# Patient Record
Sex: Male | Born: 1975 | Race: White | Hispanic: No | Marital: Married | Smoking: Never smoker
Health system: Southern US, Community
[De-identification: ages and names within clinical notes are randomized; demographics above are authoritative.]

## PROBLEM LIST (undated history)

## (undated) DIAGNOSIS — F32A Depression, unspecified: Secondary | ICD-10-CM

## (undated) DIAGNOSIS — J3489 Other specified disorders of nose and nasal sinuses: Secondary | ICD-10-CM

## (undated) DIAGNOSIS — K219 Gastro-esophageal reflux disease without esophagitis: Secondary | ICD-10-CM

## (undated) DIAGNOSIS — F329 Major depressive disorder, single episode, unspecified: Secondary | ICD-10-CM

## (undated) DIAGNOSIS — F419 Anxiety disorder, unspecified: Secondary | ICD-10-CM

## (undated) DIAGNOSIS — J343 Hypertrophy of nasal turbinates: Secondary | ICD-10-CM

## (undated) DIAGNOSIS — J342 Deviated nasal septum: Secondary | ICD-10-CM

## (undated) DIAGNOSIS — G4730 Sleep apnea, unspecified: Secondary | ICD-10-CM

## (undated) DIAGNOSIS — T4145XA Adverse effect of unspecified anesthetic, initial encounter: Secondary | ICD-10-CM

## (undated) DIAGNOSIS — I10 Essential (primary) hypertension: Secondary | ICD-10-CM

## (undated) DIAGNOSIS — T8859XA Other complications of anesthesia, initial encounter: Secondary | ICD-10-CM

## (undated) DIAGNOSIS — F988 Other specified behavioral and emotional disorders with onset usually occurring in childhood and adolescence: Secondary | ICD-10-CM

## (undated) HISTORY — PX: WISDOM TOOTH EXTRACTION: SHX21

## (undated) HISTORY — PX: INGUINAL HERNIA REPAIR: SHX194

---

## 2006-10-15 ENCOUNTER — Ambulatory Visit: Payer: Self-pay | Admitting: Family Medicine

## 2007-03-20 ENCOUNTER — Ambulatory Visit: Payer: Self-pay | Admitting: Family Medicine

## 2007-04-01 ENCOUNTER — Ambulatory Visit: Payer: Self-pay | Admitting: Family Medicine

## 2007-04-27 ENCOUNTER — Ambulatory Visit: Payer: Self-pay | Admitting: Family Medicine

## 2009-01-30 ENCOUNTER — Ambulatory Visit: Payer: Self-pay | Admitting: Professional

## 2009-02-20 ENCOUNTER — Ambulatory Visit: Payer: Self-pay | Admitting: Professional

## 2012-12-07 ENCOUNTER — Emergency Department (HOSPITAL_COMMUNITY): Payer: BC Managed Care – PPO

## 2012-12-07 ENCOUNTER — Emergency Department (HOSPITAL_COMMUNITY)
Admission: EM | Admit: 2012-12-07 | Discharge: 2012-12-07 | Disposition: A | Discharge status: Home / Self Care | Payer: BC Managed Care – PPO | Attending: Emergency Medicine | Admitting: Emergency Medicine

## 2012-12-07 DIAGNOSIS — M25519 Pain in unspecified shoulder: Secondary | ICD-10-CM | POA: Insufficient documentation

## 2012-12-07 DIAGNOSIS — R079 Chest pain, unspecified: Secondary | ICD-10-CM | POA: Insufficient documentation

## 2012-12-07 DIAGNOSIS — Z79899 Other long term (current) drug therapy: Secondary | ICD-10-CM | POA: Insufficient documentation

## 2012-12-07 LAB — BASIC METABOLIC PANEL
CO2: 26 mEq/L (ref 19–32)
Chloride: 102 mEq/L (ref 96–112)
Creatinine, Ser: 0.93 mg/dL (ref 0.50–1.35)
GFR calc Af Amer: >90 mL/min (ref >90)
Potassium: 3.8 mEq/L (ref 3.5–5.1)
Sodium: 138 mEq/L (ref 135–145)

## 2012-12-07 LAB — CBC
MCV: 86.9 fL (ref 78.0–100.0)
Platelets: 188 10*3/uL (ref 150–400)
RBC: 5.1 MIL/uL (ref 4.22–5.81)
WBC: 6.1 10*3/uL (ref 4.0–10.5)

## 2012-12-07 LAB — TROPONIN I: Troponin I: <0.3 ng/mL (ref <0.30)

## 2012-12-07 MED ORDER — IOHEXOL 300 MG/ML  SOLN
60.0000 mL | Freq: Once | INTRAMUSCULAR | Status: AC | PRN
  Administered 2012-12-07: 60 mL via INTRAVENOUS

## 2012-12-07 MED ORDER — IBUPROFEN 800 MG PO TABS: 800.0000 mg | ORAL_TABLET | Freq: Three times a day (TID) | ORAL | Status: DC

## 2012-12-07 MED ORDER — MORPHINE SULFATE 4 MG/ML IJ SOLN
4.0000 mg | Freq: Once | INTRAMUSCULAR | Status: AC
  Administered 2012-12-07: 4 mg via INTRAVENOUS
  Filled 2012-12-07: qty 1

## 2012-12-07 MED ORDER — OXYCODONE-ACETAMINOPHEN 5-325 MG PO TABS: 1.0000 | ORAL_TABLET | ORAL | Status: DC | PRN

## 2012-12-07 MED ORDER — MORPHINE SULFATE 4 MG/ML IJ SOLN
6.0000 mg | Freq: Once | INTRAMUSCULAR | Status: AC
  Administered 2012-12-07: 6 mg via INTRAVENOUS
  Filled 2012-12-07: qty 2

## 2012-12-07 MED ORDER — IOHEXOL 350 MG/ML SOLN
100.0000 mL | Freq: Once | INTRAVENOUS | Status: AC | PRN
  Administered 2012-12-07: 100 mL via INTRAVENOUS

## 2012-12-07 NOTE — ED Provider Notes (Signed)
History     CSN: [id number]  Arrival date & time 12/07/12  1528   First MD Initiated Contact with Patient 12/07/12 1537      Chief Complaint  Patient presents with  . Chest Pain     The history is provided by the patient.   patient reports developing acute onset right-sided chest pain with radiation up in towards his right neck and towards his right shoulder that occurred just after having a bowel movement when standing up from the commode.  His pain was severe and he was sharp.  He had no associated shortness of breath.  He continued to have discomfort this he went to an outside urgent care.  At the outside urgent care the patient had ongoing pain in his chest was hypertensive and they sent patient to the emergency department for further evaluation.  Patient has no medical problems.  He has no family history of early cardiac disease.  He denies nausea vomiting diarrhea.  No shortness of breath.  He does not smoke cigarettes.  He states his pain continues at this time.  No exertional shortness of breath or chest pain ever before in the past.  No past medical history on file.  No past surgical history on file.  No family history on file.  History  Substance Use Topics  . Smoking status: Not on file  . Smokeless tobacco: Not on file  . Alcohol Use: Not on file      Review of Systems  All other systems reviewed and are negative.    Allergies  Review of patient's allergies indicates no known allergies.  Home Medications   Current Outpatient Rx  Name  Route  Sig  Dispense  Refill  . AMPHETAMINE-DEXTROAMPHETAMINE 20 MG PO TABS   Oral   Take 20 mg by mouth 2 (two) times daily.         . DULOXETINE HCL 60 MG PO CPEP   Oral   Take 60 mg by mouth daily.           BP 149/103  Pulse 74  Temp 97.9 F (36.6 C) (Oral)  Resp 14  SpO2 99%  Physical Exam  Nursing note and vitals reviewed. Constitutional: He is oriented to person, place, and time. He appears  well-developed and well-nourished.  HENT:  Head: Normocephalic and atraumatic.  Eyes: EOM are normal.  Neck: Normal range of motion.  Cardiovascular: Normal rate, regular rhythm, normal heart sounds and intact distal pulses.   Pulmonary/Chest: Effort normal and breath sounds normal. No respiratory distress.  Abdominal: Soft. He exhibits no distension. There is no tenderness.  Musculoskeletal: Normal range of motion.  Neurological: He is alert and oriented to person, place, and time.  Skin: Skin is warm and dry.  Psychiatric: He has a normal mood and affect. Judgment normal.    ED Course  Procedures (including critical care time)  Labs Reviewed  CBC - Abnormal; Notable for the following:    MCHC 36.6 (*)     All other components within normal limits  BASIC METABOLIC PANEL  TROPONIN I   Dg Chest 2 View  12/07/2012  *RADIOLOGY REPORT*  Clinical Data: Chest pain.  CHEST - 2 VIEW  Comparison: Same day.  Findings: Cardiomediastinal silhouette appears normal.  No acute pulmonary disease is noted.  Bony thorax is intact.  IMPRESSION: No acute cardiopulmonary abnormality seen.   Original Report Authenticated By: Lupita Raider.,  M.D.    I personally reviewed the imaging tests  through PACS system I reviewed available ER/hospitalization records through the EMR   Date: 12/07/2012  Rate: 71  Rhythm: normal sinus rhythm  QRS Axis: normal  Intervals: normal  ST/T Wave abnormalities: normal  Conduction Disutrbances: none  Narrative Interpretation: PVC  Old EKG Reviewed: no prior ecg     No diagnosis found.    MDM  Patient continues to have some low-grade pain.  My suspicion for ACS is very low.  Given his acute onset pain and irradiation (his right shoulder and right neck dissection is still a possibility despite  A normal x-ray.  CT scan to evaluate possible aortic dissection.  Patient is currently in CT scan for        Lyanne Co, MD 12/07/12 2031

## 2012-12-07 NOTE — ED Provider Notes (Signed)
Pt received from Dr. Patria Mane.  CT negative for acute pathology.  Results, including incidental finding of multiple nodules, discussed w/ pt.  He has been reassured.  Continues to complain of pain in his upper back.  VSS and pt in NAD.  D/c'd home w/ percocet and 800mg  ibuprofen for symptomatic treatment of musculoskeletal pain.  Recommended f/u with PCP this week.  Return precautions discussed.  10:43 PM   Otilio Miu, PA-C 12/07/12 2244

## 2012-12-07 NOTE — ED Notes (Signed)
MD Campos at bedside.  

## 2012-12-07 NOTE — ED Notes (Signed)
Pt reports right shoulder pain that is "now creeping up to a four/ten pain." Denies N/V. NAD noted.

## 2012-12-07 NOTE — ED Notes (Addendum)
Per EMS: Pt from [facility] experiencing sudden 5/10 substernal CP radiating to chest and jaw. Pt reports pain dissolved on it own, reporting "chest tightness" now. Given 3 nitro en route and 324 mg aspirin. Denies N/V/SOB. AO x4. 160/100.

## 2012-12-08 NOTE — ED Provider Notes (Signed)
Medical screening examination/treatment/procedure(s) were performed by non-physician practitioner and as supervising physician I was immediately available for consultation/collaboration.  Raeford Razor, MD 12/08/12 1534

## 2013-09-04 DIAGNOSIS — J343 Hypertrophy of nasal turbinates: Secondary | ICD-10-CM

## 2013-09-04 DIAGNOSIS — J342 Deviated nasal septum: Secondary | ICD-10-CM

## 2013-09-04 HISTORY — DX: Deviated nasal septum: J34.2

## 2013-09-04 HISTORY — DX: Hypertrophy of nasal turbinates: J34.3

## 2013-09-24 ENCOUNTER — Encounter (HOSPITAL_BASED_OUTPATIENT_CLINIC_OR_DEPARTMENT_OTHER): Payer: Self-pay | Admitting: *Deleted

## 2013-09-24 DIAGNOSIS — J3489 Other specified disorders of nose and nasal sinuses: Secondary | ICD-10-CM

## 2013-09-24 HISTORY — DX: Other specified disorders of nose and nasal sinuses: J34.89

## 2013-09-24 NOTE — Progress Notes (Signed)
09/24/13 1000  OBSTRUCTIVE SLEEP APNEA  Have you ever been diagnosed with sleep apnea through a sleep study? No  Do you snore loudly (loud enough to be heard through closed doors)?  1  Do you often feel tired, fatigued, or sleepy during the daytime? 1  Has anyone observed you stop breathing during your sleep? 1  Do you have, or are you being treated for high blood pressure? 1  BMI more than 35 kg/m2? 0  Age over 38 years old? 0  Gender: 1  Obstructive Sleep Apnea Score 5  Score 4 or greater  Results sent to PCP (Dr. Joette Catching)

## 2013-09-24 NOTE — Pre-Procedure Instructions (Signed)
Will go to [place] for BMET 

## 2013-09-29 ENCOUNTER — Encounter (HOSPITAL_COMMUNITY)
Admission: RE | Admit: 2013-09-29 | Discharge: 2013-09-29 | Disposition: A | Discharge status: Home / Self Care | Payer: BC Managed Care – PPO | Source: Ambulatory Visit | Attending: Otolaryngology | Admitting: Otolaryngology

## 2013-09-29 DIAGNOSIS — Z01812 Encounter for preprocedural laboratory examination: Secondary | ICD-10-CM | POA: Insufficient documentation

## 2013-09-29 DIAGNOSIS — Z01818 Encounter for other preprocedural examination: Secondary | ICD-10-CM | POA: Insufficient documentation

## 2013-09-29 LAB — BASIC METABOLIC PANEL
BUN: 16 mg/dL (ref 6–23)
CO2: 21 mEq/L (ref 19–32)
Calcium: 9.2 mg/dL (ref 8.4–10.5)
Chloride: 102 mEq/L (ref 96–112)
Creatinine, Ser: 0.98 mg/dL (ref 0.50–1.35)
GFR calc Af Amer: >90 mL/min (ref >90)
GFR calc non Af Amer: >90 mL/min (ref >90)
Glucose, Bld: 160 mg/dL — ABNORMAL HIGH (ref 70–99)
Potassium: 4 mEq/L (ref 3.5–5.1)
Sodium: 137 mEq/L (ref 135–145)

## 2013-09-29 NOTE — H&P (Signed)
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 Chronic rhinitis (472.0) (J31.0). Esophageal reflux (530.81) (K21.9). Snoring (786.09) (R06.83). Orders  CT Sinus Limited; Requested for: 27 Jul 2013. Discussed  No radiographic evidence of sinus disease. He consider nasal septoplasty. He is going to ask his wife to observe him at night to see if he keeps his mouth open. If so then surgery may be helpful. She will also observe for any evidence of sleep apnea and if so, we will recommend a sleep study.   Postnasal drainage and is likely reflux-related. Recommend complete caffeine avoidance. If this fails to clear the symptoms, then PPI may be indicated.   . Reason For Visit  Carlos Bond is here today at the kind request of none for consultation and opinion for sinus congestion. HPI  Lifelong history of sinus problems. His symptoms include fullness of the ears, and needing to pop in his ears, stuffy nose, postnasal drip. He has some trouble breathing through his nose. He has been on antibiotics multiple times over the years. He has been on steroids as well. Nasal steroid spray has been tried but nothing has really helped. No known trauma to the nose. He does suffer with occasional heartburn. He drinks a couple of cups of coffee each morning. Allergies  No Known Drug Allergies. Current Meds  Lisinopril 10 MG Oral Tablet;; RPT Amphetamine-Dextroamphetamine 20 MG Oral Tablet;; RPT DULoxetine HCl - 60 MG Oral Capsule Delayed Release Particles;; RPT. PSH  Hernia Repair Oral Surgery Tooth Extraction. Family Hx  Family history of diabetes mellitus (V18.0) (Z83.3) Family history of hearing loss: Father (V19.2) (Z82.2) Family history of hypertension: Father (V17.49) (Z82.49) No pertinent family history: Mother Stroke syndrome: Father (I67.89). 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 ROS   Systemic: Feeling tired (fatigue).  No fever, no night sweats, and no recent weight loss. Head: Headache. Eyes: Eye symptoms. Otolaryngeal: No hearing loss  and no earache.  Tinnitus  and purulent nasal discharge.  No nasal passage blockage (stuffiness).  Snoring  and sneezing.  No hoarseness.  Sore throat. Cardiovascular: No chest pain or discomfort  and no palpitations. Pulmonary: Dyspnea, cough, and wheezing. Gastrointestinal: No dysphagia.  Heartburn.  No nausea, no abdominal pain, and no melena.  No diarrhea. Genitourinary: No dysuria. Endocrine: No muscle weakness. Musculoskeletal: No calf muscle cramps, no arthralgias, and no soft tissue swelling. Neurological: Dizziness.  No fainting, no tingling, and no numbness. Psychological: No anxiety  and no depression. Skin: No rash. 12 system ROS was obtained and reviewed on the Health Maintenance form dated today.  Positive responses are shown above.  If the symptom is not checked, the patient has denied it. Vital Signs   Recorded by Skolimowski,Sharon on 27 Jul 2013 03:18 PM BP:128/84,  Height: 70 in, Weight: 239 lb, BMI: 34.3 kg/m2,  BMI Calculated: 34.29 ,  BSA Calculated: 2.25. Physical Exam  APPEARANCE: Well developed, well nourished, in no acute distress.  Normal affect, in a pleasant mood.  Oriented to time, place and person. COMMUNICATION: Normal voice   HEAD & FACE:  No scars, lesions or masses of head and face.  Sinuses nontender to palpation.  Salivary glands without mass or tenderness.  Facial strength symmetric.  No facial lesion, scars, or mass. EYES: EOMI with normal primary gaze alignment. Visual acuity grossly intact.  PERRLA EXTERNAL EAR & NOSE: No scars, lesions or masses  EAC & TYMPANIC MEMBRANE:  EAC shows  no obstructing lesions or debris and tympanic membranes are normal bilaterally with good movement to insufflation. GROSS HEARING: Normal   TMJ:  Nontender  INTRANASAL EXAM: No polyps or purulence. Significant  bilateral septal spurs with what appears to be traumatic pleating. NASOPHARYNX: Normal, without lesions. LIPS, TEETH & GUMS: No lip lesions, normal dentition and normal gums. ORAL CAVITY/OROPHARYNX:  Oral mucosa moist without lesion or asymmetry of the palate, tongue, tonsil or posterior pharynx. NECK:  Supple without adenopathy or mass. THYROID:  Normal with no masses palpable.  NEUROLOGIC:  No gross CN deficits. No nystagmus noted.   LYMPHATIC:  No enlarged nodes palpable. Results  Sinus CT negative for sinusitis. Signature  Electronically signed by : Serena Colonel  M.D.; 07/27/2013 4:29 PM EST.

## 2013-10-04 ENCOUNTER — Encounter (HOSPITAL_BASED_OUTPATIENT_CLINIC_OR_DEPARTMENT_OTHER): Payer: BC Managed Care – PPO | Admitting: *Deleted

## 2013-10-04 ENCOUNTER — Ambulatory Visit (HOSPITAL_BASED_OUTPATIENT_CLINIC_OR_DEPARTMENT_OTHER)
Admission: RE | Admit: 2013-10-04 | Discharge: 2013-10-04 | Disposition: A | Discharge status: Home / Self Care | Payer: BC Managed Care – PPO | Source: Ambulatory Visit | Attending: Otolaryngology | Admitting: Otolaryngology

## 2013-10-04 ENCOUNTER — Encounter (HOSPITAL_BASED_OUTPATIENT_CLINIC_OR_DEPARTMENT_OTHER)
Admission: RE | Disposition: A | Discharge status: Home / Self Care | Payer: Self-pay | Source: Ambulatory Visit | Attending: Otolaryngology

## 2013-10-04 ENCOUNTER — Encounter (HOSPITAL_BASED_OUTPATIENT_CLINIC_OR_DEPARTMENT_OTHER): Payer: Self-pay | Admitting: *Deleted

## 2013-10-04 ENCOUNTER — Ambulatory Visit (HOSPITAL_BASED_OUTPATIENT_CLINIC_OR_DEPARTMENT_OTHER): Payer: BC Managed Care – PPO | Admitting: *Deleted

## 2013-10-04 DIAGNOSIS — J343 Hypertrophy of nasal turbinates: Secondary | ICD-10-CM | POA: Insufficient documentation

## 2013-10-04 DIAGNOSIS — R12 Heartburn: Secondary | ICD-10-CM | POA: Insufficient documentation

## 2013-10-04 DIAGNOSIS — J342 Deviated nasal septum: Secondary | ICD-10-CM

## 2013-10-04 DIAGNOSIS — Q674 Other congenital deformities of skull, face and jaw: Secondary | ICD-10-CM

## 2013-10-04 DIAGNOSIS — R0982 Postnasal drip: Secondary | ICD-10-CM | POA: Insufficient documentation

## 2013-10-04 HISTORY — DX: Hypertrophy of nasal turbinates: J34.3

## 2013-10-04 HISTORY — DX: Deviated nasal septum: J34.2

## 2013-10-04 HISTORY — DX: Essential (primary) hypertension: I10

## 2013-10-04 HISTORY — DX: Other specified behavioral and emotional disorders with onset usually occurring in childhood and adolescence: F98.8

## 2013-10-04 HISTORY — DX: Major depressive disorder, single episode, unspecified: F32.9

## 2013-10-04 HISTORY — DX: Depression, unspecified: F32.A

## 2013-10-04 HISTORY — DX: Other specified disorders of nose and nasal sinuses: J34.89

## 2013-10-04 HISTORY — DX: Anxiety disorder, unspecified: F41.9

## 2013-10-04 HISTORY — DX: Adverse effect of unspecified anesthetic, initial encounter: T41.45XA

## 2013-10-04 HISTORY — DX: Other complications of anesthesia, initial encounter: T88.59XA

## 2013-10-04 HISTORY — PX: NASAL SEPTOPLASTY W/ TURBINOPLASTY: SHX2070

## 2013-10-04 HISTORY — DX: Gastro-esophageal reflux disease without esophagitis: K21.9

## 2013-10-04 LAB — POCT HEMOGLOBIN-HEMACUE: Hemoglobin: 15.6 g/dL (ref 13.0–17.0)

## 2013-10-04 SURGERY — SEPTOPLASTY, NOSE, WITH NASAL TURBINATE REDUCTION
Anesthesia: General | Laterality: Bilateral | Wound class: Clean Contaminated

## 2013-10-04 MED ORDER — PROMETHAZINE HCL 25 MG RE SUPP: 25.0000 mg | Freq: Four times a day (QID) | RECTAL | Status: DC | PRN

## 2013-10-04 MED ORDER — OXYCODONE HCL 5 MG PO TABS
ORAL_TABLET | ORAL | Status: AC
  Filled 2013-10-04: qty 1

## 2013-10-04 MED ORDER — FENTANYL CITRATE 0.05 MG/ML IJ SOLN
INTRAMUSCULAR | Status: DC | PRN
  Administered 2013-10-04: 100 ug via INTRAVENOUS

## 2013-10-04 MED ORDER — OXYCODONE HCL 5 MG/5ML PO SOLN: 5.0000 mg | Freq: Once | ORAL | Status: AC | PRN

## 2013-10-04 MED ORDER — LIDOCAINE HCL (CARDIAC) 20 MG/ML IV SOLN
INTRAVENOUS | Status: DC | PRN
  Administered 2013-10-04: 100 mg via INTRAVENOUS

## 2013-10-04 MED ORDER — BACITRACIN ZINC 500 UNIT/GM EX OINT
TOPICAL_OINTMENT | CUTANEOUS | Status: AC
  Filled 2013-10-04: qty 28.35

## 2013-10-04 MED ORDER — HYDROMORPHONE HCL PF 1 MG/ML IJ SOLN
0.2500 mg | INTRAMUSCULAR | Status: DC | PRN
  Administered 2013-10-04 (×4): 0.5 mg via INTRAVENOUS

## 2013-10-04 MED ORDER — OXYMETAZOLINE HCL 0.05 % NA SOLN
NASAL | Status: AC
  Filled 2013-10-04: qty 15

## 2013-10-04 MED ORDER — MIDAZOLAM HCL 2 MG/2ML IJ SOLN
INTRAMUSCULAR | Status: AC
  Filled 2013-10-04: qty 2

## 2013-10-04 MED ORDER — FENTANYL CITRATE 0.05 MG/ML IJ SOLN
INTRAMUSCULAR | Status: AC
  Filled 2013-10-04: qty 4

## 2013-10-04 MED ORDER — LACTATED RINGERS IV SOLN
INTRAVENOUS | Status: DC
  Administered 2013-10-04 (×2): via INTRAVENOUS

## 2013-10-04 MED ORDER — MIDAZOLAM HCL 2 MG/ML PO SYRP: 12.0000 mg | ORAL_SOLUTION | Freq: Once | ORAL | Status: DC | PRN

## 2013-10-04 MED ORDER — HYDROMORPHONE HCL PF 1 MG/ML IJ SOLN
INTRAMUSCULAR | Status: AC
  Filled 2013-10-04: qty 1

## 2013-10-04 MED ORDER — PROPOFOL 10 MG/ML IV BOLUS
INTRAVENOUS | Status: DC | PRN
  Administered 2013-10-04: 50 mg via INTRAVENOUS
  Administered 2013-10-04: 250 mg via INTRAVENOUS

## 2013-10-04 MED ORDER — SUCCINYLCHOLINE CHLORIDE 20 MG/ML IJ SOLN
INTRAMUSCULAR | Status: DC | PRN
  Administered 2013-10-04: 160 mg via INTRAVENOUS

## 2013-10-04 MED ORDER — OXYMETAZOLINE HCL 0.05 % NA SOLN
NASAL | Status: DC | PRN
  Administered 2013-10-04: 1 via NASAL

## 2013-10-04 MED ORDER — PROMETHAZINE HCL 25 MG/ML IJ SOLN: 6.2500 mg | INTRAMUSCULAR | Status: DC | PRN

## 2013-10-04 MED ORDER — SUCCINYLCHOLINE CHLORIDE 20 MG/ML IJ SOLN
INTRAMUSCULAR | Status: AC
  Filled 2013-10-04: qty 1

## 2013-10-04 MED ORDER — CEPHALEXIN 500 MG PO CAPS: 500.0000 mg | ORAL_CAPSULE | Freq: Three times a day (TID) | ORAL | Status: DC

## 2013-10-04 MED ORDER — PROPOFOL 10 MG/ML IV BOLUS
INTRAVENOUS | Status: AC
  Filled 2013-10-04: qty 20

## 2013-10-04 MED ORDER — MIDAZOLAM HCL 2 MG/2ML IJ SOLN: 1.0000 mg | INTRAMUSCULAR | Status: DC | PRN

## 2013-10-04 MED ORDER — LIDOCAINE-EPINEPHRINE 1 %-1:100000 IJ SOLN
INTRAMUSCULAR | Status: DC | PRN
  Administered 2013-10-04: 4 mL

## 2013-10-04 MED ORDER — BACITRACIN ZINC 500 UNIT/GM EX OINT
TOPICAL_OINTMENT | CUTANEOUS | Status: AC
  Filled 2013-10-04: qty 0.9

## 2013-10-04 MED ORDER — LIDOCAINE-EPINEPHRINE 1 %-1:100000 IJ SOLN
INTRAMUSCULAR | Status: AC
  Filled 2013-10-04: qty 1

## 2013-10-04 MED ORDER — CEFAZOLIN SODIUM-DEXTROSE 2-3 GM-% IV SOLR
INTRAVENOUS | Status: DC | PRN
  Administered 2013-10-04: 2 g via INTRAVENOUS

## 2013-10-04 MED ORDER — MIDAZOLAM HCL 5 MG/5ML IJ SOLN
INTRAMUSCULAR | Status: DC | PRN
  Administered 2013-10-04: 2 mg via INTRAVENOUS

## 2013-10-04 MED ORDER — BACITRACIN ZINC 500 UNIT/GM EX OINT
TOPICAL_OINTMENT | CUTANEOUS | Status: DC | PRN
  Administered 2013-10-04: 1 via TOPICAL

## 2013-10-04 MED ORDER — OXYMETAZOLINE HCL 0.05 % NA SOLN
3.0000 | NASAL | Status: AC
  Administered 2013-10-04: 3 via NASAL

## 2013-10-04 MED ORDER — HYDROCODONE-ACETAMINOPHEN 7.5-325 MG PO TABS: 1.0000 | ORAL_TABLET | Freq: Four times a day (QID) | ORAL | Status: DC | PRN

## 2013-10-04 MED ORDER — OXYCODONE HCL 5 MG PO TABS
5.0000 mg | ORAL_TABLET | Freq: Once | ORAL | Status: AC | PRN
  Administered 2013-10-04: 5 mg via ORAL

## 2013-10-04 MED ORDER — MINERAL OIL LIGHT 100 % EX OIL
TOPICAL_OIL | CUTANEOUS | Status: AC
  Filled 2013-10-04: qty 25

## 2013-10-04 MED ORDER — FENTANYL CITRATE 0.05 MG/ML IJ SOLN: 50.0000 ug | INTRAMUSCULAR | Status: DC | PRN

## 2013-10-04 MED ORDER — DEXAMETHASONE SODIUM PHOSPHATE 4 MG/ML IJ SOLN
INTRAMUSCULAR | Status: DC | PRN
  Administered 2013-10-04: 10 mg via INTRAVENOUS

## 2013-10-04 SURGICAL SUPPLY — 29 items
APPLICATOR COTTON TIP 6IN STRL (MISCELLANEOUS) ×1 IMPLANT
ATTRACTOMAT 16X20 MAGNETIC DRP (DRAPES) IMPLANT
CANISTER SUCT 1200ML W/VALVE (MISCELLANEOUS) ×2 IMPLANT
DECANTER SPIKE VIAL GLASS SM (MISCELLANEOUS) ×1 IMPLANT
DRSG NASOPORE 8CM (GAUZE/BANDAGES/DRESSINGS) IMPLANT
DRSG TELFA 3X8 NADH (GAUZE/BANDAGES/DRESSINGS) ×2 IMPLANT
GAUZE SPONGE 4X4 16PLY XRAY LF (GAUZE/BANDAGES/DRESSINGS) IMPLANT
GLOVE BIOGEL PI IND STRL 7.0 (GLOVE) IMPLANT
GLOVE BIOGEL PI INDICATOR 7.0 (GLOVE) ×1
GLOVE ECLIPSE 6.5 STRL STRAW (GLOVE) ×1 IMPLANT
GLOVE ECLIPSE 7.5 STRL STRAW (GLOVE) ×2 IMPLANT
GOWN PREVENTION PLUS XLARGE (GOWN DISPOSABLE) ×4 IMPLANT
HEMOSTAT SURGICEL .5X2 ABSORB (HEMOSTASIS) IMPLANT
NEEDLE 27GAX1X1/2 (NEEDLE) ×2 IMPLANT
NS IRRIG 1000ML POUR BTL (IV SOLUTION) ×1 IMPLANT
PACK BASIN DAY SURGERY FS (CUSTOM PROCEDURE TRAY) ×2 IMPLANT
PACK ENT DAY SURGERY (CUSTOM PROCEDURE TRAY) ×2 IMPLANT
PAD DRESSING TELFA 3X8 NADH (GAUZE/BANDAGES/DRESSINGS) IMPLANT
PATTIES SURGICAL .5 X3 (DISPOSABLE) ×2 IMPLANT
SHEET SILASTIC 8X6X.030 25-30 (MISCELLANEOUS) ×1 IMPLANT
SPONGE GAUZE 2X2 8PLY STRL LF (GAUZE/BANDAGES/DRESSINGS) ×2 IMPLANT
STRIP CLOSURE SKIN 1/2X4 (GAUZE/BANDAGES/DRESSINGS) IMPLANT
SUT CHROMIC 4 0 P 3 18 (SUTURE) ×2 IMPLANT
SUT ETHILON 3 0 PS 1 (SUTURE) IMPLANT
SUT ETHILON 4 0 CL P 3 (SUTURE) ×1 IMPLANT
SUT ETHILON 6 0 P 1 (SUTURE) IMPLANT
SUT PLAIN 4 0 ~~LOC~~ 1 (SUTURE) ×2 IMPLANT
TOWEL OR 17X24 6PK STRL BLUE (TOWEL DISPOSABLE) ×2 IMPLANT
YANKAUER SUCT BULB TIP NO VENT (SUCTIONS) ×2 IMPLANT

## 2013-10-04 NOTE — Anesthesia Preprocedure Evaluation (Signed)
Anesthesia Evaluation  Patient identified by MRN, date of birth, ID band Patient awake    Reviewed: Allergy & Precautions, H&P , NPO status , Patient's Chart, lab work & pertinent test results  Airway Mallampati: II      Dental  (+) Teeth Intact   Pulmonary  breath sounds clear to auscultation        Cardiovascular Rhythm:Regular Rate:Normal     Neuro/Psych Depression    GI/Hepatic negative GI ROS, GERD-  ,  Endo/Other  negative endocrine ROS  Renal/GU negative Renal ROS     Musculoskeletal negative musculoskeletal ROS (+)   Abdominal (+) + obese,   Peds  Hematology   Anesthesia Other Findings   Reproductive/Obstetrics                           Anesthesia Physical Anesthesia Plan  ASA: II  Anesthesia Plan: General   Post-op Pain Management:    Induction: Intravenous  Airway Management Planned: Oral ETT  Additional Equipment:   Intra-op Plan:   Post-operative Plan: Extubation in OR  Informed Consent: I have reviewed the patients History and Physical, chart, labs and discussed the procedure including the risks, benefits and alternatives for the proposed anesthesia with the patient or authorized representative who has indicated his/her understanding and acceptance.   Dental advisory given  Plan Discussed with:   Anesthesia Plan Comments:         Anesthesia Quick Evaluation

## 2013-10-04 NOTE — Anesthesia Postprocedure Evaluation (Signed)
  Anesthesia Post-op Note  Patient: Carlos Bond  Procedure(s) Performed: Procedure(s): NASAL SEPTOPLASTY WITH TURBINATE REDUCTION (Bilateral)  Patient Location: PACU  Anesthesia Type:General  Level of Consciousness: awake and alert   Airway and Oxygen Therapy: Patient Spontanous Breathing  Post-op Pain: mild  Post-op Assessment: Post-op Vital signs reviewed  Post-op Vital Signs: stable  Complications: No apparent anesthesia complications

## 2013-10-04 NOTE — Transfer of Care (Signed)
Immediate Anesthesia Transfer of Care Note  Patient: Carlos Bond  Procedure(s) Performed: Procedure(s): NASAL SEPTOPLASTY WITH TURBINATE REDUCTION (Bilateral)  Patient Location: PACU  Anesthesia Type:General  Level of Consciousness: awake, alert  and patient cooperative  Airway & Oxygen Therapy: Patient Spontanous Breathing and Patient connected to face mask oxygen  Post-op Assessment: Report given to PACU RN, Post -op Vital signs reviewed and stable and Patient moving all extremities  Post vital signs: Reviewed and stable  Complications: No apparent anesthesia complications

## 2013-10-04 NOTE — Op Note (Signed)
OPERATIVE REPORT  DATE OF SURGERY: 10/04/2013  PATIENT:  Carlos Bond,  37 y.o. male  PRE-OPERATIVE DIAGNOSIS:  DEVIATED NASAL SEPTUM AND TURBINATE HYPERTROPHY  POST-OPERATIVE DIAGNOSIS:  DEVIATED NASAL SEPTUM AND TURBINATE HYPERTROPHY  PROCEDURE:  Procedure(s): NASAL SEPTOPLASTY WITH TURBINATE REDUCTION  SURGEON: Susy Frizzle, MD   ASSISTANTS: none   ANESTHESIA: General   EBL: 25 ml   DRAINS: none   LOCAL MEDICATIONS USED: 1% Xylocaine with epinephrine   SPECIMEN: none   COUNTS: Correct   PROCEDURE DETAILS:   The patient was taken to the operating room and placed on the operating table in the supine position. Following induction of general endotracheal anesthesia, the table was positioned. Face was prepped and draped in a standard fashion. Afrin spray was used preoperatively. 1% Xylocaine with epinephrine was infiltrated into the septum on both sides, columella and the inferior turbinates bilaterally. Afrin pledges were placed in the nasal cavities bilaterally.   #1-septoplasty. A left hemitransfixion incision was used to approach the septal cartilage. A Cottle elevator was used to elevate mucosa posteriorly down the left side all the way to the sphenoid rostrum. A left floor tunnel was elevated as well and the two were connected. 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 This caused bilateral obstruction that was severe. The majority of the ethmoid plate, vomer and maxillary crest were resected. This allowed the septum to lie in midline position opening of the airways bilaterally. The left-sided incision was reapproximated with chromic suture. A quilting stitch was placed to reapposed the septal flaps bilaterally. There were bilateral tears present. Silastic splints were cut to size and shape and applied bilaterally secured in place with a through and through nylon suture.  #2-submucous resection inferior turbinates. The leading edge of inferior turbinates was  incised in a vertical fashion with a 15 scalpel. Cottle elevator was used to elevate mucosa off bone in all directions. Bony fragments were crushed and small pieces were removed. Turbinate remnants were outfractured bilaterally. Nasal cavities were suctioned of blood and secretions and packed with rolled up Telfa coated with bacitracin ointment. Pharynx was suctioned of blood and secretions. Patient was awakened extubated and transferred to recovery in stable condition.   PATIENT DISPOSITION: To PACU, stable

## 2013-10-04 NOTE — Anesthesia Procedure Notes (Signed)
Procedure Name: Intubation Date/Time: 10/04/2013 9:03 AM Performed by: Serena Colonel Pre-anesthesia Checklist: Patient identified, Emergency Drugs available, Suction available and Patient being monitored Patient Re-evaluated:Patient Re-evaluated prior to inductionOxygen Delivery Method: Circle System Utilized Preoxygenation: Pre-oxygenation with 100% oxygen Intubation Type: IV induction Ventilation: Mask ventilation without difficulty Laryngoscope Size: 4 Grade View: Grade III Tube type: Oral Number of attempts: 1 Airway Equipment and Method: stylet,  oral airway and Video-laryngoscopy Placement Confirmation: ETT inserted through vocal cords under direct vision,  positive ETCO2 and breath sounds checked- equal and bilateral Secured at: 24 cm Tube secured with: Tape Dental Injury: Teeth and Oropharynx as per pre-operative assessment

## 2013-10-04 NOTE — Interval H&P Note (Signed)
History and Physical Interval Note:  10/04/2013 8:34 AM  Carlos Bond  has presented today for surgery, with the diagnosis of DEVIATED NASAL SEPTUM AND TURBINATE HYPERTROPHY  The various methods of treatment have been discussed with the patient and family. After consideration of risks, benefits and other options for treatment, the patient has consented to  Procedure(s): NASAL SEPTOPLASTY WITH TURBINATE REDUCTION (Bilateral) as a surgical intervention .  The patient's history has been reviewed, patient examined, no change in status, stable for surgery.  I have reviewed the patient's chart and labs.  Questions were answered to the patient's satisfaction.     [name]

## 2013-10-05 ENCOUNTER — Encounter (HOSPITAL_BASED_OUTPATIENT_CLINIC_OR_DEPARTMENT_OTHER): Payer: Self-pay | Admitting: Otolaryngology

## 2016-07-19 ENCOUNTER — Encounter (HOSPITAL_COMMUNITY): Payer: Self-pay

## 2016-07-19 ENCOUNTER — Emergency Department (HOSPITAL_COMMUNITY): Payer: Worker's Compensation

## 2016-07-19 ENCOUNTER — Emergency Department (HOSPITAL_COMMUNITY)
Admission: EM | Admit: 2016-07-19 | Discharge: 2016-07-20 | Disposition: A | Discharge status: Home / Self Care | Payer: Worker's Compensation | Attending: Emergency Medicine | Admitting: Emergency Medicine

## 2016-07-19 DIAGNOSIS — S92402A Displaced unspecified fracture of left great toe, initial encounter for closed fracture: Secondary | ICD-10-CM

## 2016-07-19 DIAGNOSIS — F909 Attention-deficit hyperactivity disorder, unspecified type: Secondary | ICD-10-CM | POA: Diagnosis not present

## 2016-07-19 DIAGNOSIS — S92422A Displaced fracture of distal phalanx of left great toe, initial encounter for closed fracture: Secondary | ICD-10-CM | POA: Diagnosis not present

## 2016-07-19 DIAGNOSIS — Y939 Activity, unspecified: Secondary | ICD-10-CM | POA: Insufficient documentation

## 2016-07-19 DIAGNOSIS — Z79899 Other long term (current) drug therapy: Secondary | ICD-10-CM | POA: Insufficient documentation

## 2016-07-19 DIAGNOSIS — W208XXA Other cause of strike by thrown, projected or falling object, initial encounter: Secondary | ICD-10-CM | POA: Insufficient documentation

## 2016-07-19 DIAGNOSIS — Y999 Unspecified external cause status: Secondary | ICD-10-CM | POA: Diagnosis not present

## 2016-07-19 DIAGNOSIS — Y929 Unspecified place or not applicable: Secondary | ICD-10-CM | POA: Insufficient documentation

## 2016-07-19 DIAGNOSIS — S99922A Unspecified injury of left foot, initial encounter: Secondary | ICD-10-CM | POA: Diagnosis present

## 2016-07-19 DIAGNOSIS — I10 Essential (primary) hypertension: Secondary | ICD-10-CM | POA: Diagnosis not present

## 2016-07-19 HISTORY — DX: Sleep apnea, unspecified: G47.30

## 2016-07-19 MED ORDER — OXYCODONE-ACETAMINOPHEN 5-325 MG PO TABS
2.0000 | ORAL_TABLET | Freq: Once | ORAL | Status: AC
  Administered 2016-07-20: 2 via ORAL
  Filled 2016-07-19: qty 2

## 2016-07-19 NOTE — ED Triage Notes (Signed)
Patient states he dropped a piece of wood onto left foot/toe. C/o burning and numbness to left foot. Limited ROM and bruising noted.

## 2016-07-20 MED ORDER — OXYCODONE-ACETAMINOPHEN 5-325 MG PO TABS: 1.0000 | ORAL_TABLET | ORAL | 0 refills | Status: DC | PRN

## 2016-07-20 MED ORDER — LISINOPRIL 10 MG PO TABS: 10.0000 mg | ORAL_TABLET | Freq: Every day | ORAL | 0 refills | Status: DC

## 2016-07-20 NOTE — ED Notes (Signed)
Patient verbalizes understanding of discharge instructions, prescriptions, home care and follow up care. Patient out of department at this time with family. 

## 2016-07-20 NOTE — ED Provider Notes (Signed)
[facility]-EM[facility]Y DEPT Provider Note   CSN: [id number] Arrival date & time: 07/19/16  2203     History   Chief Complaint Chief Complaint  Patient presents with  . Foot Pain    HPI Carlos Bond is a 40 y.o. male.  HPI  Carlos Bond is a 40 y.o. male who presents to the Emergency Department complaining of left great toe pain for one week.  He states that he accidentally dropped a heavy piece of wood onto his foot.  He reports persistent, throbbing pain to the toe that's associated with weight bearing and movement.  He also complains of swelling and discoloration of the toenail.  He denies pain to the proximal foot, numbness or open wounds.  He has been taking OTC analgesics without relief.     Past Medical History:  Diagnosis Date  . ADD (attention deficit disorder)   . Anxiety   . Complication of anesthesia    states woke up during wisdom teeth extraction  . Depression   . Deviated nasal septum 09/2013  . GERD (gastroesophageal reflux disease)   . Hypertension    under control with med., has been on med. < 1 yr.  . Nasal turbinate hypertrophy 09/2013  . Sleep apnea   . Stuffy and runny nose 09/24/2013   clear drainage from nose    There are no active problems to display for this patient.   Past Surgical History:  Procedure Laterality Date  . INGUINAL HERNIA REPAIR     as a child  . NASAL SEPTOPLASTY W/ TURBINOPLASTY Bilateral 10/04/2013   Procedure: NASAL SEPTOPLASTY WITH TURBINATE REDUCTION;  Surgeon: Serena Colonel, MD;  Location: [facility];  Service: ENT;  Laterality: Bilateral;  . WISDOM TOOTH EXTRACTION     as a teenager       Home Medications    Prior to Admission medications   Medication Sig Start Date End Date Taking? Authorizing Provider  amphetamine-dextroamphetamine (ADDERALL) 20 MG tablet Take 20 mg by mouth 2 (two) times daily.    Historical Provider, MD  cephALEXin (KEFLEX) 500 MG capsule Take 1 capsule (500 mg total) by mouth 3  (three) times daily. 10/04/13   Serena Colonel, MD  DULoxetine (CYMBALTA) 60 MG capsule Take 90 mg by mouth daily.     Historical Provider, MD  HYDROcodone-acetaminophen (NORCO) 7.5-325 MG per tablet Take 1 tablet by mouth every 6 (six) hours as needed for moderate pain. 10/04/13   Serena Colonel, MD  lisinopril (PRINIVIL,ZESTRIL) 10 MG tablet Take 1 tablet (10 mg total) by mouth daily. 07/20/16   Marlee Armenteros, PA-C  LORazepam (ATIVAN) 0.5 MG tablet Take 0.5 mg by mouth every 8 (eight) hours.    Historical Provider, MD  oxyCODONE-acetaminophen (PERCOCET/ROXICET) 5-325 MG tablet Take 1 tablet by mouth every 4 (four) hours as needed. 07/20/16   Tawan Degroote, PA-C  promethazine (PHENERGAN) 25 MG suppository Place 1 suppository (25 mg total) rectally every 6 (six) hours as needed for nausea or vomiting. 10/04/13   Serena Colonel, MD  ranitidine (ZANTAC) 150 MG tablet Take 150 mg by mouth 2 (two) times daily.    Historical Provider, MD    Family History History reviewed. No pertinent family history.  Social History Social History  Substance Use Topics  . Smoking status: Never Smoker  . Smokeless tobacco: Never Used  . Alcohol use Yes     Comment: 2-3x / week     Allergies   Review of patient's allergies indicates no known  allergies.   Review of Systems Review of Systems  Constitutional: Negative for chills and fever.  Musculoskeletal: Positive for arthralgias (left great toe pain and swelling) and joint swelling.  Skin: Negative for wound.  Neurological: Negative for weakness and numbness.  All other systems reviewed and are negative.    Physical Exam Updated Vital Signs BP (!) 176/112 (BP Location: Left Arm)   Pulse 83   Temp 98.6 F (37 C) (Oral)   Resp 18   Ht 5\' 9"  (1.753 m)   Wt 113.4 kg   SpO2 99%   BMI 36.92 kg/m   Physical Exam  Constitutional: He is oriented to person, place, and time. He appears well-developed and well-nourished. No distress.  HENT:  Head:  Normocephalic and atraumatic.  Cardiovascular: Normal rate, regular rhythm and intact distal pulses.   Pulmonary/Chest: Effort normal and breath sounds normal.  Musculoskeletal: He exhibits edema and tenderness. He exhibits no deformity.  Diffuse ttp of the left great toe with subungual hematoma. DP pulse is brisk,distal sensation intact.  No bony deformity.  No proximal tenderness.  Neurological: He is alert and oriented to person, place, and time. He exhibits normal muscle tone. Coordination normal.  Skin: Skin is warm and dry.  Nursing note and vitals reviewed.    ED Treatments / Results  Labs (all labs ordered are listed, but only abnormal results are displayed) Labs Reviewed - No data to display  EKG  EKG Interpretation None       Radiology Dg Ankle Complete Left  Result Date: 07/19/2016 CLINICAL DATA:  Pain and swelling. Left great toe pain, left ankle soreness. Dropped a piece of wood on his foot. History of left ankle fracture. EXAM: LEFT ANKLE COMPLETE - 3+ VIEW COMPARISON:  None. FINDINGS: There is no evidence of fracture, dislocation, or joint effusion. There is no evidence of arthropathy or other focal bone abnormality. Soft tissues are unremarkable. IMPRESSION: Negative radiographs of the left ankle. Electronically Signed   By: Rubye Oaks M.D.   On: 07/19/2016 23:22   Dg Foot Complete Left  Result Date: 07/19/2016 CLINICAL DATA:  Pain and swelling. Left great toe pain, left ankle soreness. Dropped a piece of wood on his foot. History of left ankle fracture. EXAM: LEFT FOOT - COMPLETE 3+ VIEW COMPARISON:  None. FINDINGS: Transverse mildly displaced great toe distal phalanx fracture. There is no intra-articular extension. No additional acute fracture of the foot. Soft tissue edema about the fracture site. IMPRESSION: Transverse minimally displaced great toe distal phalanx fracture. Electronically Signed   By: Rubye Oaks M.D.   On: 07/19/2016 23:22     Procedures Procedures (including critical care time)  Medications Ordered in ED Medications  oxyCODONE-acetaminophen (PERCOCET/ROXICET) 5-325 MG per tablet 2 tablet (2 tablets Oral Given 07/20/16 0000)     Initial Impression / Assessment and Plan / ED Course  I have reviewed the triage vital signs and the nursing notes.  Pertinent labs & imaging results that were available during my care of the patient were reviewed by me and considered in my medical decision making (see chart for details).  Clinical Course    Pt with closed fx of the left great toe.  Subungual hematoma present, pt declined to have it drained.    Jones dressing applied, post op shoe and crutches given.  Discussed importance of close orthopedic or podiatry follow-up.    Pt remains hypertensive, has hx of same. Asymptomatic except for foot pain.   Has been off his lisinopril  for months, will refill it with understanding that he will arrange PMD f/u  Final Clinical Impressions(s) / ED Diagnoses   Final diagnoses:  Fractured great toe, left, closed, initial encounter  Essential hypertension    New Prescriptions New Prescriptions   LISINOPRIL (PRINIVIL,ZESTRIL) 10 MG TABLET    Take 1 tablet (10 mg total) by mouth daily.   OXYCODONE-ACETAMINOPHEN (PERCOCET/ROXICET) 5-325 MG TABLET    Take 1 tablet by mouth every 4 (four) hours as needed.     Rosey Bathammy Abrina Petz, PA-C 07/21/16 2033    Devoria AlbeIva Knapp, MD 08/07/16 2302

## 2016-07-20 NOTE — Discharge Instructions (Signed)
Elevate your foot when possible and apply ice packs on/off.  Call one of the providers listed to arrange a follow-up appt.  Return to ER for any worsening symptoms.  Its also important to follow-up with your primary doctor regarding your high blood pressure

## 2016-07-22 ENCOUNTER — Telehealth: Payer: Self-pay | Admitting: Orthopedic Surgery

## 2016-07-22 NOTE — Telephone Encounter (Signed)
Call received from [place] insurance adjuster Robie RidgeKathy Riser ph [phone], fax [phone]-[phone], requesting appointment for fractue of toe, [facility] 07/19/16; Claim # received [id number].  Forms faxed to her for completion; appointment pending. Patient aware of status

## 2016-07-23 ENCOUNTER — Ambulatory Visit (INDEPENDENT_AMBULATORY_CARE_PROVIDER_SITE_OTHER): Payer: Worker's Compensation | Admitting: Orthopaedic Surgery

## 2016-07-23 ENCOUNTER — Encounter: Payer: Self-pay | Admitting: Orthopaedic Surgery

## 2016-07-23 ENCOUNTER — Encounter: Payer: Self-pay | Admitting: Orthopedic Surgery

## 2016-07-23 VITALS — BP 148/90 | HR 70 | Temp 97.6°F | Ht 70.0 in | Wt 252.0 lb

## 2016-07-23 DIAGNOSIS — S92402A Displaced unspecified fracture of left great toe, initial encounter for closed fracture: Secondary | ICD-10-CM | POA: Diagnosis not present

## 2016-07-23 DIAGNOSIS — S90222A Contusion of left lesser toe(s) with damage to nail, initial encounter: Secondary | ICD-10-CM

## 2016-07-23 NOTE — Progress Notes (Signed)
Subjective: I broke my left great toe    Patient ID: Carlos Bond, male    DOB: [birth year], 40 y.o.   MRN: [id number]  HPI  He dropped a large piece of wood on his left great toe on 07-12-16.  He had pain, bluish color to the great toe and it hurt.  He waited a week and went to the ER on 07-19-16 as it was not any better.  X-rays shows a fracture transverse of the left great toe distal phalanx nondisplaced.  He has no other injury.  He was given post op shoe and pain medicine.  I have reviewed the x-rays and the reports from the ER.   Review of Systems  HENT: Negative for congestion.   Respiratory: Negative for cough and shortness of breath.   Cardiovascular: Negative for chest pain and leg swelling.  Endocrine: Negative for cold intolerance.  Musculoskeletal: Positive for arthralgias, gait problem and joint swelling.  Allergic/Immunologic: Negative for environmental allergies.   Past Medical History:  Diagnosis Date  . ADD (attention deficit disorder)   . Anxiety   . Complication of anesthesia    states woke up during wisdom teeth extraction  . Depression   . Deviated nasal septum 09/2013  . GERD (gastroesophageal reflux disease)   . Hypertension    under control with med., has been on med. < 1 yr.  . Nasal turbinate hypertrophy 09/2013  . Sleep apnea   . Stuffy and runny nose 09/24/2013   clear drainage from nose    Past Surgical History:  Procedure Laterality Date  . INGUINAL HERNIA REPAIR     as a child  . NASAL SEPTOPLASTY W/ TURBINOPLASTY Bilateral 10/04/2013   Procedure: NASAL SEPTOPLASTY WITH TURBINATE REDUCTION;  Surgeon: Serena ColonelJefry Rosen, MD;  Location: [facility];  Service: ENT;  Laterality: Bilateral;  . WISDOM TOOTH EXTRACTION     as a teenager    Current Outpatient Prescriptions on File Prior to Visit  Medication Sig Dispense Refill  . amphetamine-dextroamphetamine (ADDERALL) 20 MG tablet Take 20 mg by mouth 2 (two) times daily.    . cephALEXin  (KEFLEX) 500 MG capsule Take 1 capsule (500 mg total) by mouth 3 (three) times daily. 15 capsule 0  . DULoxetine (CYMBALTA) 60 MG capsule Take 90 mg by mouth daily.     [name]. HYDROcodone-acetaminophen (NORCO) 7.5-325 MG per tablet Take 1 tablet by mouth every 6 (six) hours as needed for moderate pain. 30 tablet 0  . lisinopril (PRINIVIL,ZESTRIL) 10 MG tablet Take 1 tablet (10 mg total) by mouth daily. 30 tablet 0  . LORazepam (ATIVAN) 0.5 MG tablet Take 0.5 mg by mouth every 8 (eight) hours.    [name]. oxyCODONE-acetaminophen (PERCOCET/ROXICET) 5-325 MG tablet Take 1 tablet by mouth every 4 (four) hours as needed. 20 tablet 0  . promethazine (PHENERGAN) 25 MG suppository Place 1 suppository (25 mg total) rectally every 6 (six) hours as needed for nausea or vomiting. (Patient not taking: Reported on 07/23/2016) 12 each 0  . ranitidine (ZANTAC) 150 MG tablet Take 150 mg by mouth 2 (two) times daily.     No current facility-administered medications on file prior to visit.     Social History   Social History  . Marital status: Married    Spouse name: N/A  . Number of children: N/A  . Years of education: N/A   Occupational History  . Not on file.   Social History Main Topics  . Smoking status: Never  Smoker  . Smokeless tobacco: Never Used  . Alcohol use Yes     Comment: 2-3x / week  . Drug use: No  . Sexual activity: Not on file   Other Topics Concern  . Not on file   Social History Narrative  . No narrative on file    Family History  Problem Relation Age of Onset  . Stroke Father   . Diabetes Father   . Hypertension Father     BP (!) 148/90   Pulse 70   Temp 97.6 F (36.4 C)   Ht 5\' 10"  (1.778 m)   Wt 252 lb (114.3 kg)   BMI 36.16 kg/m      Objective:   Physical Exam  Constitutional: He is oriented to person, place, and time. He appears well-developed and well-nourished.  HENT:  Head: Normocephalic and atraumatic.  Eyes: Conjunctivae and EOM are normal. Pupils are equal,  round, and reactive to light.  Neck: Normal range of motion. Neck supple.  Cardiovascular: Normal rate, regular rhythm and intact distal pulses.   Pulmonary/Chest: Effort normal.  Abdominal: Soft.  Musculoskeletal: He exhibits tenderness (His left great toe has subungual hematoma under the nail with blue color.  NV intact.  Right foot negative.).  Neurological: He is alert and oriented to person, place, and time. He has normal reflexes. He displays normal reflexes. No cranial nerve deficit. He exhibits normal muscle tone. Coordination normal.  Skin: Skin is warm and dry.  Psychiatric: He has a normal mood and affect. His behavior is normal. Judgment and thought content normal.   Procedure note  After sterile prep and permission from the patient, a butane lighter was used and a needle was used to pierce the nail of the left great toe and release the hematoma under the nail.  He tolerated it well.  He has much less pain from the toe after the procedure.  Blood came out of the hole in the nail.  A dressing was applied.       Assessment & Plan:   Encounter Diagnoses  Name Primary?  . Fractured great toe, left, closed, initial encounter Yes  . Subungual contusion of toe of left foot, initial encounter    Apply neosporin ointment tomorrow to the nail.  Use the post op shoe  Elevate the foot.  Ice to the foot.  Call if any problem.  Precautions discussed.  Electronically Signed Darreld Mclean, MD 9/19/20172:50 PM

## 2016-07-23 NOTE — Telephone Encounter (Signed)
Apiiontment has been scheduled for today, 07/23/16, 2:00pm, patient and insurer, [employer], workers comp, aware.

## 2016-07-30 ENCOUNTER — Encounter: Payer: Self-pay | Admitting: Orthopaedic Surgery

## 2016-07-30 ENCOUNTER — Ambulatory Visit (INDEPENDENT_AMBULATORY_CARE_PROVIDER_SITE_OTHER): Payer: Worker's Compensation

## 2016-07-30 ENCOUNTER — Ambulatory Visit (INDEPENDENT_AMBULATORY_CARE_PROVIDER_SITE_OTHER): Payer: Worker's Compensation | Admitting: Orthopaedic Surgery

## 2016-07-30 VITALS — BP 155/78 | HR 75 | Temp 97.3°F | Ht 71.0 in | Wt 254.0 lb

## 2016-07-30 DIAGNOSIS — S92912D Unspecified fracture of left toe(s), subsequent encounter for fracture with routine healing: Secondary | ICD-10-CM

## 2016-07-30 MED ORDER — HYDROCODONE-ACETAMINOPHEN 7.5-325 MG PO TABS: ORAL_TABLET | ORAL | 0 refills | Status: DC

## 2016-07-30 NOTE — Progress Notes (Signed)
Patient Carlos Bond Carlos Bond, male DOB:Aug 16, 1976, 40 y.o. VWU:[id number]  Chief Complaint  Patient presents with  . Follow-up    fractured left great toe    HPI  Carlos Bond is a 40 y.o. male who has a fracture of the left great toe.  He had evacuation of the subungual hematoma last visit.  The toe still hurts some. He is using a post op shoe.  He has no redness,no new trauma. HPI  Body mass index is 35.43 kg/m.  ROS  Review of Systems  HENT: Negative for congestion.   Respiratory: Negative for cough and shortness of breath.   Cardiovascular: Negative for chest pain and leg swelling.  Endocrine: Negative for cold intolerance.  Musculoskeletal: Positive for arthralgias, gait problem and joint swelling.  Allergic/Immunologic: Negative for environmental allergies.    Past Medical History:  Diagnosis Date  . ADD (attention deficit disorder)   . Anxiety   . Complication of anesthesia    states woke up during wisdom teeth extraction  . Depression   . Deviated nasal septum 09/2013  . GERD (gastroesophageal reflux disease)   . Hypertension    under control with med., has been on med. < 1 yr.  . Nasal turbinate hypertrophy 09/2013  . Sleep apnea   . Stuffy and runny nose 09/24/2013   clear drainage from nose    Past Surgical History:  Procedure Laterality Date  . INGUINAL HERNIA REPAIR     as a child  . NASAL SEPTOPLASTY W/ TURBINOPLASTY Bilateral 10/04/2013   Procedure: NASAL SEPTOPLASTY WITH TURBINATE REDUCTION;  Surgeon: Serena Colonel, MD;  Location: [facility];  Service: ENT;  Laterality: Bilateral;  . WISDOM TOOTH EXTRACTION     as a teenager    Family History  Problem Relation Age of Onset  . Stroke Father   . Diabetes Father   . Hypertension Father     Social History Social History  Substance Use Topics  . Smoking status: Never Smoker  . Smokeless tobacco: Never Used  . Alcohol use Yes     Comment: 2-3x / week    No Known  Allergies  Current Outpatient Prescriptions  Medication Sig Dispense Refill  . amphetamine-dextroamphetamine (ADDERALL) 20 MG tablet Take 20 mg by mouth 2 (two) times daily.    . DULoxetine (CYMBALTA) 60 MG capsule Take 90 mg by mouth daily.     [name] lisinopril (PRINIVIL,ZESTRIL) 10 MG tablet Take 1 tablet (10 mg total) by mouth daily. 30 tablet 0  . LORazepam (ATIVAN) 0.5 MG tablet Take 0.5 mg by mouth every 8 (eight) hours.    [name] oxyCODONE-acetaminophen (PERCOCET/ROXICET) 5-325 MG tablet Take 1 tablet by mouth every 4 (four) hours as needed. 20 tablet 0  . ranitidine (ZANTAC) 150 MG tablet Take 150 mg by mouth 2 (two) times daily.     No current facility-administered medications for this visit.      Physical Exam  Blood pressure (!) 155/78, pulse 75, temperature 97.3 F (36.3 C), height 5\' 11"  (1.803 m), weight 254 lb (115.2 kg).  Constitutional: overall normal hygiene, normal nutrition, well developed, normal grooming, normal body habitus. Assistive device:post op shoe  Musculoskeletal: gait and station Limp left, muscle tone and strength are normal, no tremors or atrophy is present.  .  Neurological: coordination overall normal.  Deep tendon reflex/nerve stretch intact.  Sensation normal.  Cranial nerves II-XII intact.   Skin:   Normal overall no scars, lesions, ulcers or rashes. No psoriasis.  Psychiatric:  Alert and oriented x 3.  Recent memory intact, remote memory unclear.  Normal mood and affect. Well groomed.  Good eye contact.  Cardiovascular: overall no swelling, no varicosities, no edema bilaterally, normal temperatures of the legs and arms, no clubbing, cyanosis and good capillary refill.  Lymphatic: palpation is normal.  The left great toe has contusion.  It is not draining.  It is not red.  It is very tender.  NV intact.  The patient has been educated about the nature of the problem(s) and counseled on treatment options.  The patient appeared to understand what I have  discussed and is in agreement with it.  Encounter Diagnosis  Name Primary?  [name]. Toe fracture, left, with routine healing, subsequent encounter Yes    PLAN Call if any problems.  Precautions discussed.  Continue current medications.   Return to clinic 2 weeks   X-rays of the great toe on return  CAM walker given left.  Electronically Signed Darreld McleanWayne Torah Pinnock, MD 9/26/20179:20 AM

## 2016-08-13 ENCOUNTER — Ambulatory Visit (INDEPENDENT_AMBULATORY_CARE_PROVIDER_SITE_OTHER): Payer: Worker's Compensation | Admitting: Orthopaedic Surgery

## 2016-08-13 ENCOUNTER — Encounter: Payer: Self-pay | Admitting: Orthopaedic Surgery

## 2016-08-13 ENCOUNTER — Ambulatory Visit (INDEPENDENT_AMBULATORY_CARE_PROVIDER_SITE_OTHER): Payer: Worker's Compensation

## 2016-08-13 VITALS — BP 158/112 | HR 82 | Temp 97.2°F | Ht 71.0 in | Wt 257.0 lb

## 2016-08-13 DIAGNOSIS — S92912D Unspecified fracture of left toe(s), subsequent encounter for fracture with routine healing: Secondary | ICD-10-CM

## 2016-08-13 MED ORDER — HYDROCODONE-ACETAMINOPHEN 7.5-325 MG PO TABS: ORAL_TABLET | ORAL | 0 refills | Status: DC

## 2016-08-13 NOTE — Progress Notes (Signed)
Patient ZO:XWRUEA CAIUS SILBERNAGEL, male DOB:01/02/1976, 40 y.o. VWU:[id number]  Chief Complaint  Patient presents with  . Follow-up    left great toe fracture    HPI  IRBY FAILS is a 40 y.o. male who has continued pain of the left great toe.  He has no new trauma.  He uses his CAM walker.  He has no redness. HPI  Body mass index is 35.84 kg/m.  ROS  Review of Systems  HENT: Negative for congestion.   Respiratory: Negative for cough and shortness of breath.   Cardiovascular: Negative for chest pain and leg swelling.  Endocrine: Negative for cold intolerance.  Musculoskeletal: Positive for arthralgias, gait problem and joint swelling.  Allergic/Immunologic: Negative for environmental allergies.    Past Medical History:  Diagnosis Date  . ADD (attention deficit disorder)   . Anxiety   . Complication of anesthesia    states woke up during wisdom teeth extraction  . Depression   . Deviated nasal septum 09/2013  . GERD (gastroesophageal reflux disease)   . Hypertension    under control with med., has been on med. < 1 yr.  . Nasal turbinate hypertrophy 09/2013  . Sleep apnea   . Stuffy and runny nose 09/24/2013   clear drainage from nose    Past Surgical History:  Procedure Laterality Date  . INGUINAL HERNIA REPAIR     as a child  . NASAL SEPTOPLASTY W/ TURBINOPLASTY Bilateral 10/04/2013   Procedure: NASAL SEPTOPLASTY WITH TURBINATE REDUCTION;  Surgeon: Serena Colonel, MD;  Location: [facility];  Service: ENT;  Laterality: Bilateral;  . WISDOM TOOTH EXTRACTION     as a teenager    Family History  Problem Relation Age of Onset  . Stroke Father   . Diabetes Father   . Hypertension Father     Social History Social History  Substance Use Topics  . Smoking status: Never Smoker  . Smokeless tobacco: Never Used  . Alcohol use Yes     Comment: 2-3x / week    No Known Allergies  Current Outpatient Prescriptions  Medication Sig Dispense Refill  .  amphetamine-dextroamphetamine (ADDERALL) 20 MG tablet Take 20 mg by mouth 2 (two) times daily.    . DULoxetine (CYMBALTA) 60 MG capsule Take 90 mg by mouth daily.     [name] HYDROcodone-acetaminophen (NORCO) 7.5-325 MG tablet One every four hours for pain as needed.  Do not drive car or operate machinery while taking this medicine.  Must last 14 days. 56 tablet 0  . lisinopril (PRINIVIL,ZESTRIL) 10 MG tablet Take 1 tablet (10 mg total) by mouth daily. 30 tablet 0  . LORazepam (ATIVAN) 0.5 MG tablet Take 0.5 mg by mouth every 8 (eight) hours.    . ranitidine (ZANTAC) 150 MG tablet Take 150 mg by mouth 2 (two) times daily.     No current facility-administered medications for this visit.      Physical Exam  Blood pressure (!) 158/112, pulse 82, temperature 97.2 F (36.2 C), height 5\' 11"  (1.803 m), weight 257 lb (116.6 kg).  Constitutional: overall normal hygiene, normal nutrition, well developed, normal grooming, normal body habitus. Assistive device:CAM walker  Musculoskeletal: gait and station Limp left, muscle tone and strength are normal, no tremors or atrophy is present.  .  Neurological: coordination overall normal.  Deep tendon reflex/nerve stretch intact.  Sensation normal.  Cranial nerves II-XII intact.   Skin:   Normal overall no scars, lesions, ulcers or rashes. No psoriasis.  Psychiatric:  Alert and oriented x 3.  Recent memory intact, remote memory unclear.  Normal mood and affect. Well groomed.  Good eye contact.  Cardiovascular: overall no swelling, no varicosities, no edema bilaterally, normal temperatures of the legs and arms, no clubbing, cyanosis and good capillary refill.  Lymphatic: palpation is normal.  The left great toe nail is discolored.  He has no redness or swelling. NV intact.  The patient has been educated about the nature of the problem(s) and counseled on treatment options.  The patient appeared to understand what I have discussed and is in agreement with  it.  Encounter Diagnosis  Name Primary?  . Closed fracture of phalanx of toe of left foot with routine healing, physeal involvement unspecified, unspecified toe, subsequent encounter Yes   X-rays of the great toe were taken and reported separately.  PLAN Call if any problems.  Precautions discussed.  Continue current medications.   Return to clinic 3 weeks   X-rays on return.  Continue CAM walker.  Electronically Signed Darreld McleanWayne Makesha Belitz, MD 10/10/201710:31 AM

## 2016-09-03 ENCOUNTER — Ambulatory Visit (INDEPENDENT_AMBULATORY_CARE_PROVIDER_SITE_OTHER): Payer: Worker's Compensation

## 2016-09-03 ENCOUNTER — Encounter: Payer: Self-pay | Admitting: Orthopaedic Surgery

## 2016-09-03 ENCOUNTER — Ambulatory Visit (INDEPENDENT_AMBULATORY_CARE_PROVIDER_SITE_OTHER): Payer: Worker's Compensation | Admitting: Orthopaedic Surgery

## 2016-09-03 DIAGNOSIS — S92405D Nondisplaced unspecified fracture of left great toe, subsequent encounter for fracture with routine healing: Secondary | ICD-10-CM

## 2016-09-03 NOTE — Patient Instructions (Signed)
Out of work 

## 2016-09-03 NOTE — Progress Notes (Signed)
Patient WU:JWJXBJ:Carlos Bond, male DOB:January 23, 1976, [age] y.o. YNW:[id number]  No chief complaint on file.   HPI  Carlos Bond is a 40 y.o. male who has a fracture of the left great toe.  He is still using a CAM walker.  He still has pain.  He has no redness or swelling. HPI  There is no height or weight on file to calculate BMI.  ROS  Review of Systems  HENT: Negative for congestion.   Respiratory: Negative for cough and shortness of breath.   Cardiovascular: Negative for chest pain and leg swelling.  Endocrine: Negative for cold intolerance.  Musculoskeletal: Positive for arthralgias, gait problem and joint swelling.  Allergic/Immunologic: Negative for environmental allergies.    Past Medical History:  Diagnosis Date  . ADD (attention deficit disorder)   . Anxiety   . Complication of anesthesia    states woke up during wisdom teeth extraction  . Depression   . Deviated nasal septum 09/2013  . GERD (gastroesophageal reflux disease)   . Hypertension    under control with med., has been on med. < 1 yr.  . Nasal turbinate hypertrophy 09/2013  . Sleep apnea   . Stuffy and runny nose 09/24/2013   clear drainage from nose    Past Surgical History:  Procedure Laterality Date  . INGUINAL HERNIA REPAIR     as a child  . NASAL SEPTOPLASTY W/ TURBINOPLASTY Bilateral 10/04/2013   Procedure: NASAL SEPTOPLASTY WITH TURBINATE REDUCTION;  Surgeon: Serena ColonelJefry Rosen, MD;  Location: [facility];  Service: ENT;  Laterality: Bilateral;  . WISDOM TOOTH EXTRACTION     as a teenager    Family History  Problem Relation Age of Onset  . Stroke Father   . Diabetes Father   . Hypertension Father     Social History Social History  Substance Use Topics  . Smoking status: Never Smoker  . Smokeless tobacco: Never Used  . Alcohol use Yes     Comment: 2-3x / week    No Known Allergies  Current Outpatient Prescriptions  Medication Sig Dispense Refill  .  amphetamine-dextroamphetamine (ADDERALL) 20 MG tablet Take 20 mg by mouth 2 (two) times daily.    . DULoxetine (CYMBALTA) 60 MG capsule Take 90 mg by mouth daily.     [name]. HYDROcodone-acetaminophen (NORCO) 7.5-325 MG tablet One every four hours for pain as needed.  Do not drive car or operate machinery while taking this medicine.  Must last 14 days. 56 tablet 0  . lisinopril (PRINIVIL,ZESTRIL) 10 MG tablet Take 1 tablet (10 mg total) by mouth daily. 30 tablet 0  . LORazepam (ATIVAN) 0.5 MG tablet Take 0.5 mg by mouth every 8 (eight) hours.    . ranitidine (ZANTAC) 150 MG tablet Take 150 mg by mouth 2 (two) times daily.     No current facility-administered medications for this visit.      Physical Exam  There were no vitals taken for this visit.  Constitutional: overall normal hygiene, normal nutrition, well developed, normal grooming, normal body habitus. Assistive device:CAM walker  Musculoskeletal: gait and station Limp left, muscle tone and strength are normal, no tremors or atrophy is present.  .  Neurological: coordination overall normal.  Deep tendon reflex/nerve stretch intact.  Sensation normal.  Cranial nerves II-XII intact.   Skin:   Normal overall no scars, lesions, ulcers or rashes. No psoriasis.  Psychiatric: Alert and oriented x 3.  Recent memory intact, remote memory unclear.  Normal mood and affect.  Well groomed.  Good eye contact.  Cardiovascular: overall no swelling, no varicosities, no edema bilaterally, normal temperatures of the legs and arms, no clubbing, cyanosis and good capillary refill.  Lymphatic: palpation is normal.  His left great toe nail is still present with discoloration.  NV is intact.  He has no swelling.  The patient has been educated about the nature of the problem(s) and counseled on treatment options.  The patient appeared to understand what I have discussed and is in agreement with it.  Encounter Diagnosis  Name Primary?  . Closed nondisplaced  fracture of phalanx of left great toe with routine healing, unspecified phalanx, subsequent encounter Yes    PLAN Call if any problems.  Precautions discussed.  Continue current medications.   Return to clinic 1 month    X-rays on return of the left great toe.  Gradually wean out of the CAM walker.  Stay out of work.  Electronically Signed Darreld McleanWayne Mataya Kilduff, MD 10/31/201711:11 AM

## 2016-09-17 ENCOUNTER — Other Ambulatory Visit: Payer: Self-pay | Admitting: Orthopaedic Surgery

## 2016-09-17 MED ORDER — HYDROCODONE-ACETAMINOPHEN 7.5-325 MG PO TABS: ORAL_TABLET | ORAL | 0 refills | Status: DC

## 2016-10-01 ENCOUNTER — Encounter: Payer: Self-pay | Admitting: Orthopaedic Surgery

## 2016-10-01 ENCOUNTER — Ambulatory Visit (INDEPENDENT_AMBULATORY_CARE_PROVIDER_SITE_OTHER): Payer: Worker's Compensation

## 2016-10-01 ENCOUNTER — Other Ambulatory Visit: Payer: Self-pay | Admitting: Orthopedic Surgery

## 2016-10-01 ENCOUNTER — Ambulatory Visit (INDEPENDENT_AMBULATORY_CARE_PROVIDER_SITE_OTHER): Payer: Worker's Compensation | Admitting: Orthopaedic Surgery

## 2016-10-01 DIAGNOSIS — S92405D Nondisplaced unspecified fracture of left great toe, subsequent encounter for fracture with routine healing: Secondary | ICD-10-CM

## 2016-10-01 NOTE — Progress Notes (Signed)
Patient ZO:XWRUEA:Carlos Bond, male DOB:05-20-1976, 40 y.o. VWU:[id number]  Chief Complaint  Patient presents with  . Follow-up    left great toe fracture, DOI 07/12/16    HPI  Carlos Bond is a 40 y.o. male who has fracture of the left great toe.  He says the nail is about ready to come off.  He has pain with walking in regular shoes.  X-rays were done today. HPI  There is no height or weight on file to calculate BMI.  ROS  Review of Systems  Past Medical History:  Diagnosis Date  . ADD (attention deficit disorder)   . Anxiety   . Complication of anesthesia    states woke up during wisdom teeth extraction  . Depression   . Deviated nasal septum 09/2013  . GERD (gastroesophageal reflux disease)   . Hypertension    under control with med., has been on med. < 1 yr.  . Nasal turbinate hypertrophy 09/2013  . Sleep apnea   . Stuffy and runny nose 09/24/2013   clear drainage from nose    Past Surgical History:  Procedure Laterality Date  . INGUINAL HERNIA REPAIR     as a child  . NASAL SEPTOPLASTY W/ TURBINOPLASTY Bilateral 10/04/2013   Procedure: NASAL SEPTOPLASTY WITH TURBINATE REDUCTION;  Surgeon: Serena ColonelJefry Rosen, MD;  Location: [facility];  Service: ENT;  Laterality: Bilateral;  . WISDOM TOOTH EXTRACTION     as a teenager    Family History  Problem Relation Age of Onset  . Stroke Father   . Diabetes Father   . Hypertension Father     Social History Social History  Substance Use Topics  . Smoking status: Never Smoker  . Smokeless tobacco: Never Used  . Alcohol use Yes     Comment: 2-3x / week    No Known Allergies  Current Outpatient Prescriptions  Medication Sig Dispense Refill  . amphetamine-dextroamphetamine (ADDERALL) 20 MG tablet Take 20 mg by mouth 2 (two) times daily.    . DULoxetine (CYMBALTA) 60 MG capsule Take 90 mg by mouth daily.     [name]. HYDROcodone-acetaminophen (NORCO) 7.5-325 MG tablet One every four hours for pain as needed.  Do not  drive car or operate machinery while taking this medicine.  Must last 14 days. 56 tablet 0  . lisinopril (PRINIVIL,ZESTRIL) 10 MG tablet Take 1 tablet (10 mg total) by mouth daily. 30 tablet 0  . LORazepam (ATIVAN) 0.5 MG tablet Take 0.5 mg by mouth every 8 (eight) hours.    . ranitidine (ZANTAC) 150 MG tablet Take 150 mg by mouth 2 (two) times daily.     No current facility-administered medications for this visit.      Physical Exam  There were no vitals taken for this visit.  Constitutional: overall normal hygiene, normal nutrition, well developed, normal grooming, normal body habitus. Assistive device:none  Musculoskeletal: gait and station Limp left, muscle tone and strength are normal, no tremors or atrophy is present.  .  Neurological: coordination overall normal.  Deep tendon reflex/nerve stretch intact.  Sensation normal.  Cranial nerves II-XII intact.   Skin:   Normal overall no scars, lesions, ulcers or rashes. No psoriasis.  Psychiatric: Alert and oriented x 3.  Recent memory intact, remote memory unclear.  Normal mood and affect. Well groomed.  Good eye contact.  Cardiovascular: overall no swelling, no varicosities, no edema bilaterally, normal temperatures of the legs and arms, no clubbing, cyanosis and good capillary refill.  Lymphatic:  palpation is normal.  Left great toe nail is slightly loose as the underlying nail is growing in.  NV is intact. Toe looks normal.  NV intact.  He complains of more pain of the plantar great toe.  X-rays were done and reported separately.  The patient has been educated about the nature of the problem(s) and counseled on treatment options.  The patient appeared to understand what I have discussed and is in agreement with it.  Encounter Diagnosis  Name Primary?  . Closed nondisplaced fracture of phalanx of left great toe with routine healing, unspecified phalanx, subsequent encounter Yes    PLAN Call if any problems.  Precautions  discussed.  Continue current medications.   Return to clinic 3 weeks   Electronically Signed Darreld McleanWayne Shelden Raborn, MD 11/28/20179:51 AM

## 2016-10-01 NOTE — Progress Notes (Signed)
f °

## 2016-10-06 ENCOUNTER — Telehealth: Payer: Self-pay | Admitting: Orthopedic Surgery

## 2016-10-07 MED ORDER — HYDROCODONE-ACETAMINOPHEN 7.5-325 MG PO TABS: ORAL_TABLET | ORAL | 0 refills | Status: DC

## 2016-10-22 ENCOUNTER — Ambulatory Visit (INDEPENDENT_AMBULATORY_CARE_PROVIDER_SITE_OTHER): Payer: Worker's Compensation | Admitting: Orthopaedic Surgery

## 2016-10-22 DIAGNOSIS — S92405D Nondisplaced unspecified fracture of left great toe, subsequent encounter for fracture with routine healing: Secondary | ICD-10-CM

## 2016-10-22 NOTE — Progress Notes (Signed)
Patient ZO:XWRUEA:Carlos Bond, male DOB:April 14, 1976, 40 y.o. VWU:[id number]  Chief Complaint  Patient presents with  . Follow-up    Left great toe fracture, No XRAY needed    HPI  Carlos Bond is a 40 y.o. male who had a fracture of the left great toe.  His nail has come off and the underlying nail is not tender.  He has much less pain.  He has just some slight tenderness at times.  He has no new trauma. HPI  There is no height or weight on file to calculate BMI.  ROS  Review of Systems  HENT: Negative for congestion.   Respiratory: Negative for cough and shortness of breath.   Cardiovascular: Negative for chest pain and leg swelling.  Endocrine: Negative for cold intolerance.  Musculoskeletal: Positive for arthralgias, gait problem and joint swelling.  Allergic/Immunologic: Negative for environmental allergies.    Past Medical History:  Diagnosis Date  . ADD (attention deficit disorder)   . Anxiety   . Complication of anesthesia    states woke up during wisdom teeth extraction  . Depression   . Deviated nasal septum 09/2013  . GERD (gastroesophageal reflux disease)   . Hypertension    under control with med., has been on med. < 1 yr.  . Nasal turbinate hypertrophy 09/2013  . Sleep apnea   . Stuffy and runny nose 09/24/2013   clear drainage from nose    Past Surgical History:  Procedure Laterality Date  . INGUINAL HERNIA REPAIR     as a child  . NASAL SEPTOPLASTY W/ TURBINOPLASTY Bilateral 10/04/2013   Procedure: NASAL SEPTOPLASTY WITH TURBINATE REDUCTION;  Surgeon: Serena ColonelJefry Rosen, MD;  Location: [facility];  Service: ENT;  Laterality: Bilateral;  . WISDOM TOOTH EXTRACTION     as a teenager    Family History  Problem Relation Age of Onset  . Stroke Father   . Diabetes Father   . Hypertension Father     Social History Social History  Substance Use Topics  . Smoking status: Never Smoker  . Smokeless tobacco: Never Used  . Alcohol use Yes   Comment: 2-3x / week    No Known Allergies  Current Outpatient Prescriptions  Medication Sig Dispense Refill  . amphetamine-dextroamphetamine (ADDERALL) 20 MG tablet Take 20 mg by mouth 2 (two) times daily.    . DULoxetine (CYMBALTA) 60 MG capsule Take 90 mg by mouth daily.     [name]. HYDROcodone-acetaminophen (NORCO) 7.5-325 MG tablet One every four hours for pain as needed.  Do not drive car or operate machinery while taking this medicine.  Must last 14 days. 56 tablet 0  . lisinopril (PRINIVIL,ZESTRIL) 10 MG tablet Take 1 tablet (10 mg total) by mouth daily. 30 tablet 0  . LORazepam (ATIVAN) 0.5 MG tablet Take 0.5 mg by mouth every 8 (eight) hours.    . ranitidine (ZANTAC) 150 MG tablet Take 150 mg by mouth 2 (two) times daily.     No current facility-administered medications for this visit.      Physical Exam  There were no vitals taken for this visit.  Constitutional: overall normal hygiene, normal nutrition, well developed, normal grooming, normal body habitus. Assistive device:none  Musculoskeletal: gait and station Limp none, muscle tone and strength are normal, no tremors or atrophy is present.  .  Neurological: coordination overall normal.  Deep tendon reflex/nerve stretch intact.  Sensation normal.  Cranial nerves II-XII intact.   Skin:   Normal overall no scars,  lesions, ulcers or rashes. No psoriasis.  Psychiatric: Alert and oriented x 3.  Recent memory intact, remote memory unclear.  Normal mood and affect. Well groomed.  Good eye contact.  Cardiovascular: overall no swelling, no varicosities, no edema bilaterally, normal temperatures of the legs and arms, no clubbing, cyanosis and good capillary refill.  Lymphatic: palpation is normal.  His left great toe has normal sensation, normal gait.  The patient has been educated about the nature of the problem(s) and counseled on treatment options.  The patient appeared to understand what I have discussed and is in agreement  with it.  Encounter Diagnosis  Name Primary?  . Closed nondisplaced fracture of phalanx of left great toe with routine healing, unspecified phalanx, subsequent encounter Yes    PLAN Call if any problems.  Precautions discussed.  Continue current medications.   Return to clinic PRN   Electronically Signed Darreld McleanWayne Telma Pyeatt, MD 12/19/201710:07 AM

## 2016-10-24 ENCOUNTER — Telehealth: Payer: Self-pay | Admitting: Orthopaedic Surgery

## 2016-10-24 MED ORDER — HYDROCODONE-ACETAMINOPHEN 7.5-325 MG PO TABS: ORAL_TABLET | ORAL | 0 refills | Status: DC

## 2016-11-06 ENCOUNTER — Encounter: Payer: Self-pay | Admitting: Orthopaedic Surgery

## 2016-11-06 ENCOUNTER — Telehealth: Payer: Self-pay | Admitting: Orthopaedic Surgery

## 2016-11-06 NOTE — Telephone Encounter (Signed)
Patient was last seen on by you onTuesday, October 22, 2016.  You discharged him at that time but no work note was given.  WC wants a note that the patient was able to go back to work on Wednesday, October 23, 2016.  Is it okay for me to write a return to work note?

## 2016-11-06 NOTE — Telephone Encounter (Signed)
Yes you may write that note.

## 2016-11-13 ENCOUNTER — Other Ambulatory Visit: Payer: Self-pay | Admitting: *Deleted

## 2016-11-13 ENCOUNTER — Other Ambulatory Visit: Payer: Self-pay | Admitting: Orthopaedic Surgery

## 2016-11-13 MED ORDER — HYDROCODONE-ACETAMINOPHEN 7.5-325 MG PO TABS: ORAL_TABLET | ORAL | 0 refills | Status: DC

## 2016-12-01 ENCOUNTER — Telehealth: Payer: Self-pay | Admitting: Orthopedic Surgery

## 2016-12-02 ENCOUNTER — Other Ambulatory Visit: Payer: Self-pay | Admitting: Orthopedic Surgery

## 2016-12-02 MED ORDER — HYDROCODONE-ACETAMINOPHEN 7.5-325 MG PO TABS: ORAL_TABLET | ORAL | 0 refills | Status: DC

## 2017-06-17 ENCOUNTER — Telehealth (HOSPITAL_COMMUNITY): Payer: Self-pay

## 2017-06-30 IMAGING — DX DG FOOT COMPLETE 3+V*L*
3 series · 3 of 3 positions shown · non-contrast
Comparison: None.

CLINICAL DATA: Pain and swelling. Left great toe pain, left ankle
soreness. Dropped a piece of Mayol on his foot. History of left ankle
fracture.

EXAM:
LEFT FOOT - COMPLETE 3+ VIEW

[foot ap]
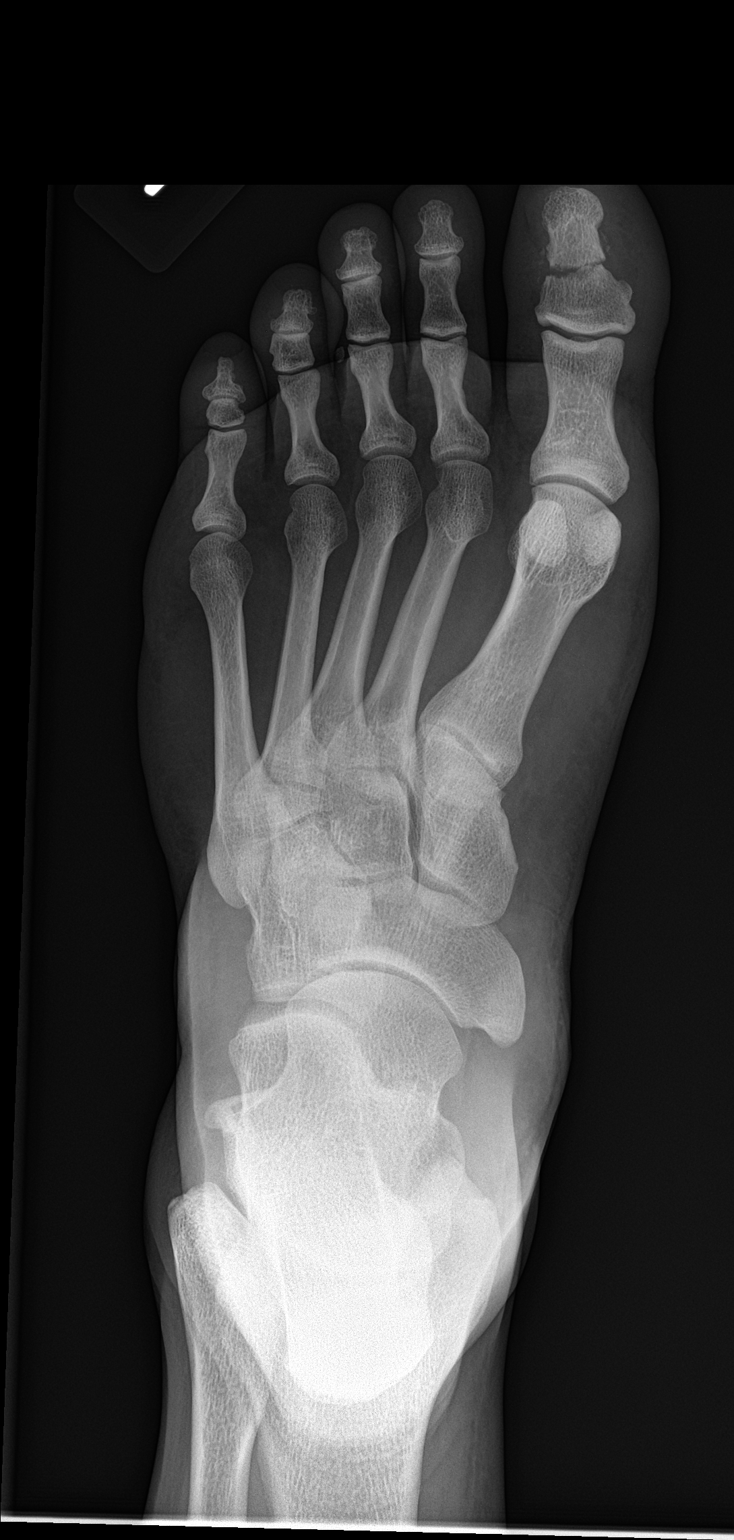

[foot obl]
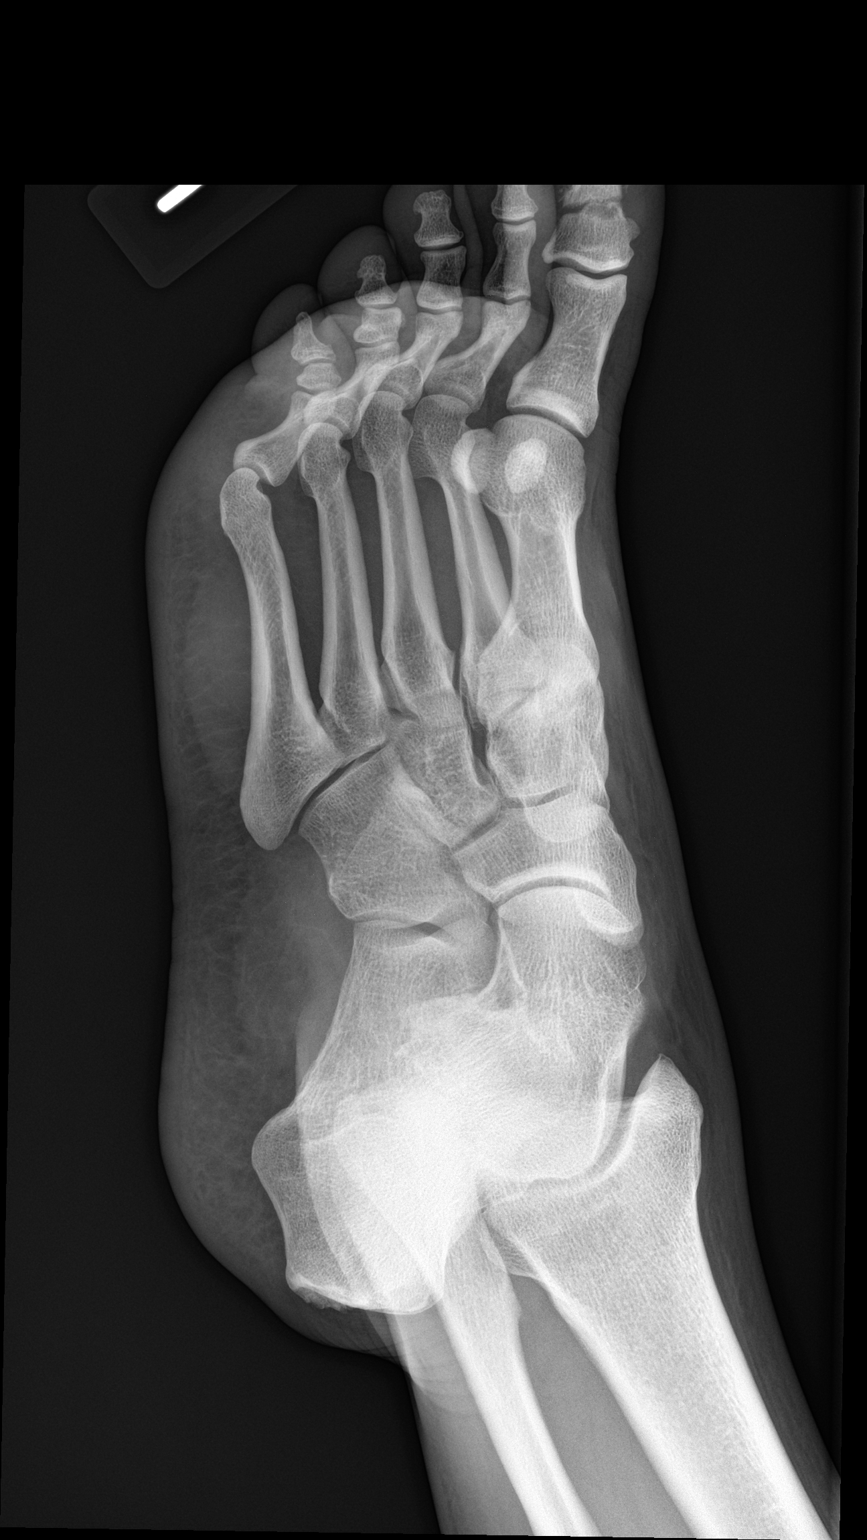

[foot lat]
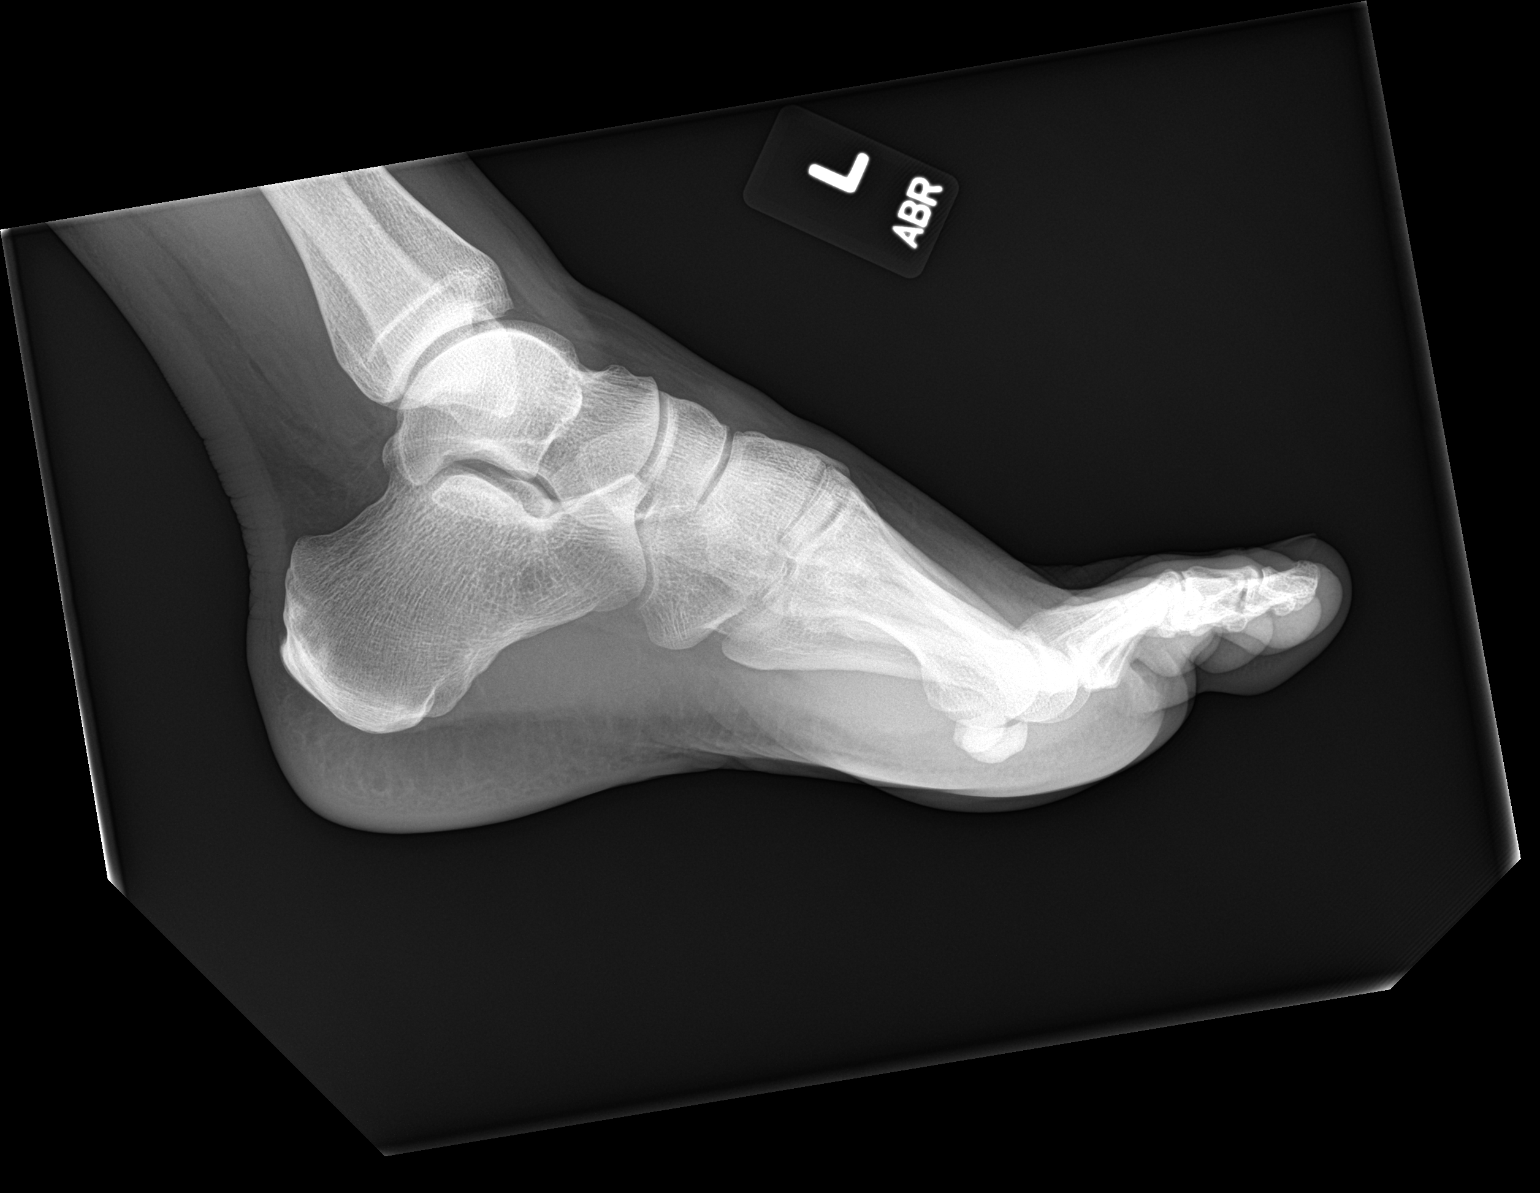

[3 of 3 positions shown; findings below may reference images not displayed]

FINDINGS: Transverse mildly displaced great toe distal phalanx fracture. There
is no intra-articular extension. No additional acute fracture of the
foot. Soft tissue edema about the fracture site.
IMPRESSION: Transverse minimally displaced great toe distal phalanx fracture.
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
## 2017-07-22 ENCOUNTER — Encounter (HOSPITAL_COMMUNITY): Payer: Self-pay | Admitting: Psychiatry

## 2017-07-22 ENCOUNTER — Ambulatory Visit (INDEPENDENT_AMBULATORY_CARE_PROVIDER_SITE_OTHER): Payer: Self-pay | Admitting: Psychiatry

## 2017-07-22 VITALS — BP 140/104 | HR 89 | Ht 71.0 in | Wt 280.8 lb

## 2017-07-22 DIAGNOSIS — G4700 Insomnia, unspecified: Secondary | ICD-10-CM

## 2017-07-22 DIAGNOSIS — I10 Essential (primary) hypertension: Secondary | ICD-10-CM

## 2017-07-22 DIAGNOSIS — Z818 Family history of other mental and behavioral disorders: Secondary | ICD-10-CM

## 2017-07-22 DIAGNOSIS — F331 Major depressive disorder, recurrent, moderate: Secondary | ICD-10-CM | POA: Insufficient documentation

## 2017-07-22 DIAGNOSIS — F1099 Alcohol use, unspecified with unspecified alcohol-induced disorder: Secondary | ICD-10-CM

## 2017-07-22 DIAGNOSIS — Z560 Unemployment, unspecified: Secondary | ICD-10-CM

## 2017-07-22 DIAGNOSIS — Z79899 Other long term (current) drug therapy: Secondary | ICD-10-CM

## 2017-07-22 DIAGNOSIS — F419 Anxiety disorder, unspecified: Secondary | ICD-10-CM

## 2017-07-22 DIAGNOSIS — G4733 Obstructive sleep apnea (adult) (pediatric): Secondary | ICD-10-CM

## 2017-07-22 MED ORDER — ESCITALOPRAM OXALATE 20 MG PO TABS: ORAL_TABLET | ORAL | 1 refills | Status: DC

## 2017-07-22 MED ORDER — LORAZEPAM 0.5 MG PO TABS: 0.5000 mg | ORAL_TABLET | Freq: Every day | ORAL | 0 refills | Status: DC

## 2017-07-22 MED ORDER — TRAZODONE HCL 50 MG PO TABS: ORAL_TABLET | ORAL | 1 refills | Status: DC

## 2017-07-22 NOTE — Patient Instructions (Addendum)
-   Decrease sertraline 50 mg daily for one week, then discontinue - Start lexapro 10 mg daily for one week, then 20 mg daily - Discontinue ambien - Start Trazodone 25-50 mg at night as needed for sleep - Limit use of ativan 0.5 mg daily as needed for anxiety - Return to clinic in one month for 30 mins - Referral to therapy

## 2017-08-08 ENCOUNTER — Encounter (HOSPITAL_COMMUNITY): Payer: Self-pay | Admitting: Psychiatry

## 2017-08-08 ENCOUNTER — Telehealth (HOSPITAL_COMMUNITY): Payer: Self-pay | Admitting: Psychiatry

## 2017-08-08 NOTE — Telephone Encounter (Signed)
This encounter was created in error - please disregard.

## 2017-08-08 NOTE — Telephone Encounter (Signed)
Received a phone call from his wife. She is very concerned about the patient- she noticed that the pill bottle of her and daughter is gone. She also states that his mother's pain medication is gone as well. She states that those medication include Klonopin, adderall, clonidine. He drinks 6-12 beers per day, although she is unsure of it as she is out for work. She is not aware of him stating SI or taking medication as suicide attempt. She has not taken away guns at home.   Discussed the following.  - Removed guns from home so that he does not have access to it - Secure pill bottle to avoid miss use/overmedication - She will contact 911 or bring him to emergency room if any concern of SI/HI - She agrees to bring him to the appointment; reschedule to earlier date if possible  Called patient twice. Left voice message to contact the office.

## 2017-08-18 NOTE — Progress Notes (Signed)
BH MD/PA/NP OP Progress Note  08/21/2017 11:18 AM Carlos Bond  MRN:  [id number]  Chief Complaint:  Chief Complaint    Depression; Follow-up     HPI:  - Received a phone call from his wife. She is very concerned about the patient- she noticed that the pill bottle of her and daughter is gone. She also states that his mother's pain medication is gone as well. She states that those medication include Klonopin, adderall, clonidine. He drinks 6-12 beers per day, although she is unsure of it as she is out for work. She is not aware of him stating SI or taking medication as suicide attempt. She has not taken away guns at home  He presents for follow up appointment for depression. He states that he has been irritable. He admits drinking alcohol feeling depressed. He talks about 42 year old twin, who lives with their mother, who he claims as "narcissist is a polite word" to describe her. He states that they "don't want to do nothing with me" since he had "breakdown" of worsening depression years ago. He felt horrible and also felt hurt when they did not contact with him, although they had a game in [place], which is close to where he lives. He also states (he does not disclose this to his wife) that he has had issues of misuse of prescription drug for 20 years. He started to do this after starting job, traveling across the country as he got bored. He denies any misuse of medication since summer. He admits drinking alcohol significantly a few weeks ago to feel "numb." He quits alcohol two weeks ago because he feels "tired of feeling this way." He has craving for alcohol. He wants to "be alive, be better, and wake up, looking forward to tomorrow." He states that he is willing to try anything that is advised.   He endorses insomnia. He uses CPAP machine. He feels fatigue, irritable and depressed. He reports anhedonia, stating that he used to enjoy mountain biking or outside activity. He denies SI, HI, AH/VH. He  feels anxious. He denies panic attacks. He has not taken ativan. He has not taken Ambien for a month (although it was refilled this month per report).   His wife present to the interview.  She updated since the phone encounter; he misused medication in summer, but not recently; she could not confirm it with him when she called the office, as she could not awake him. She feels proud of him that he has been in sobriety for two weeks. She excused herself from the interview later to have some privacy for him.    Per PMP,  Ativan filled on 07/29/2017 for 30 tabs,  Ambien 5 mg 30 tabs filled on 08/04/2017  Visit Diagnosis:    ICD-10-CM   1. Alcohol use disorder, moderate, dependence (HCC) F10.20 Ambulatory referral to [facility] Intensive OP Program    Comprehensive Metabolic Panel (CMET)  2. Moderate episode of recurrent major depressive disorder (HCC) F33.1 TSH    CBC with Differential    Past Psychiatric History:  I have reviewed the patient's psychiatry history in detail and updated the patient record. Outpatient: for ADHD years ago,  Psychiatry admission: denies Previous suicide attempt: overdosed medication in 2015, Past trials of medication: sertraline, fluoxetine, Paxil, Effexor, duloxetine (worked well but could not afford it), adderall (worsening anxiety), melatonin,  History of violence: denies (he was violent after overdosed on Ambien)  Past Medical History:  Past Medical History:  Diagnosis Date  . ADD (attention deficit disorder)   . Anxiety   . Complication of anesthesia    states woke up during wisdom teeth extraction  . Depression   . Deviated nasal septum 09/2013  . GERD (gastroesophageal reflux disease)   . Hypertension    under control with med., has been on med. < 1 yr.  . Nasal turbinate hypertrophy 09/2013  . Sleep apnea   . Stuffy and runny nose 09/24/2013   clear drainage from nose    Past Surgical History:  Procedure Laterality Date  . INGUINAL HERNIA REPAIR      as a child  . NASAL SEPTOPLASTY W/ TURBINOPLASTY Bilateral 10/04/2013   Procedure: NASAL SEPTOPLASTY WITH TURBINATE REDUCTION;  Surgeon: Serena Colonel, MD;  Location: [facility];  Service: ENT;  Laterality: Bilateral;  . WISDOM TOOTH EXTRACTION     as a teenager    Family Psychiatric History:  I have reviewed the patient's family history in detail and updated the patient record. Father- dementia, paternal sister- committed suicide in 70's  Family History:  Family History  Problem Relation Age of Onset  . Stroke Father   . Diabetes Father   . Hypertension Father     Social History:  Social History   Social History  . Marital status: Married    Spouse name: N/A  . Number of children: N/A  . Years of education: N/A   Social History Main Topics  . Smoking status: Never Smoker  . Smokeless tobacco: Never Used  . Alcohol use Yes     Comment: 2-3x / week, 07-22-2017 2x wkly beer/wine  . Drug use: No     Comment: 07-22-2017 per pt no  . Sexual activity: Not on file   Other Topics Concern  . Not on file   Social History Narrative  . No narrative on file    Allergies: No Known Allergies  Metabolic Disorder Labs: No results found for: HGBA1C, MPG No results found for: PROLACTIN No results found for: CHOL, TRIG, HDL, CHOLHDL, VLDL, LDLCALC No results found for: TSH  Therapeutic Level Labs: No results found for: LITHIUM No results found for: VALPROATE No components found for:  CBMZ  Current Medications: Current Outpatient Prescriptions  Medication Sig Dispense Refill  . amphetamine-dextroamphetamine (ADDERALL) 20 MG tablet Take 20 mg by mouth 2 (two) times daily.    [name] escitalopram (LEXAPRO) 20 MG tablet Take 1 tablet (20 mg total) by mouth daily. 30 tablet 1  . QUEtiapine (SEROQUEL) 25 MG tablet Take 1 tablet (25 mg total) by mouth at bedtime. 30 tablet 1  . traZODone (DESYREL) 50 MG tablet Take 1 tablet (50 mg total) by mouth at bedtime as needed for  sleep. 30 tablet 1   No current facility-administered medications for this visit.      Musculoskeletal: Strength & Muscle Tone: within normal limits Gait & Station: normal Patient leans: N/A  Psychiatric Specialty Exam: Review of Systems  Psychiatric/Behavioral: Positive for depression and substance abuse. Negative for hallucinations and suicidal ideas. The patient is nervous/anxious and has insomnia.   All other systems reviewed and are negative.   Blood pressure (!) 162/107, pulse 83, height  (1.803 m), weight 288 lb 6.4 oz (130.8 kg).Body mass index is 40.22 kg/m.  General Appearance: Fairly Groomed  Eye Contact:  Good  Speech:  Clear and Coherent  Volume:  Normal  Mood:  Depressed and Irritable  Affect:  Appropriate, Congruent and down, frustrated  Thought Process:  Coherent and Goal Directed  Orientation:  Full (Time, Place, and Person)  Thought Content: Logical Perceptions: denies AH/VH  Suicidal Thoughts:  No  Homicidal Thoughts:  No  Memory:  Immediate;   Good Recent;   Good Remote;   Good  Judgement:  Fair  Insight:  Present  Psychomotor Activity:  Normal  Concentration:  Concentration: Good and Attention Span: Good  Recall:  Good  Fund of Knowledge: Good  Language: Good  Akathisia:  No  Handed:  Right  AIMS (if indicated): not done  Assets:  Communication Skills Desire for Improvement  ADL's:  Intact  Cognition: WNL  Sleep:  Poor   Screenings:   Assessment and Plan:  Carlos Bond is a 41 y.o. year old male with a history of depression, anxiety, alcohol use disorder, hypertension, sleep apnea, who presents for follow up appointment for Alcohol use disorder, moderate, dependence (HCC) - Plan: Ambulatory referral to [facility] Intensive OP Program, Comprehensive Metabolic Panel (CMET)  Moderate episode of recurrent major depressive disorder (HCC) - Plan: TSH, CBC with Differential  # Alcohol use disorder, severe Patient is at planning stage for  sobriety. He agrees to go to [facility]; will make a referral. Although pharmacological treatment is discussed with patient for alcohol abstinence, he is not interested in at this time. Noted that he reports history of misuse of prescribed medication. Will continue motivational interview. Will obtain labs.   # MDD, moderate, recurrent with anxious distress # r/o alcohol induced mood disorder Patient reports worsening mood and irritability in the settling of alcohol relapse (has been abstinent for two weeks). Will continue lexapro for depression. Will continue quetiapine for adjunctive treatment for depression, insomnia and irritability. Discussed metabolic side effect. Will continue trazodone prn for insomnia. Will obtain lab to rule out medical cause.   # r/o alcohol use disorder He reports binge alcohol use, although he is motivated for sobriety. Will continue motivational interview.   # Hypertension He is advised to see his PCP for treatment.  Plan 1. Continue lexapro 20 mg daily  2. Start quetiapine 25 mg at night (3. Discontinue ativan) 4. Continue trazodone 50 mg at night as needed for sleep 5. Return to clinic in one month for 30 mins 6. Referral to therapy 7. Referral to [facility] 8. Obtain blood test (CBC, CMP, TSH)  The patient demonstrates the following risk factors for suicide: Chronic risk factors for suicide include: psychiatric disorder of depression and completed suicide in a family member. Acute risk factors for suicide include: family or marital conflict and unemployment. Protective factors for this patient include: positive social support, responsibility to others (children, family), coping skills and hope for the future. Considering these factors, the overall suicide risk at this point appears to be low. Patient is appropriate for outpatient follow up. His guns are locked at home.   The duration of this appointment visit was 30 minutes of face-to-face time with the patient.   Greater than 50% of this time was spent in counseling, explanation of  diagnosis, planning of further management, and coordination of care.  Neysa Hotter, MD 08/21/2017, 11:18 AM

## 2017-08-21 ENCOUNTER — Ambulatory Visit (INDEPENDENT_AMBULATORY_CARE_PROVIDER_SITE_OTHER): Payer: Self-pay | Admitting: Psychiatry

## 2017-08-21 VITALS — BP 162/107 | HR 83 | Ht 71.0 in | Wt 288.4 lb

## 2017-08-21 DIAGNOSIS — G4700 Insomnia, unspecified: Secondary | ICD-10-CM

## 2017-08-21 DIAGNOSIS — R450 Nervousness: Secondary | ICD-10-CM

## 2017-08-21 DIAGNOSIS — F1020 Alcohol dependence, uncomplicated: Secondary | ICD-10-CM

## 2017-08-21 DIAGNOSIS — Z810 Family history of intellectual disabilities: Secondary | ICD-10-CM

## 2017-08-21 DIAGNOSIS — I10 Essential (primary) hypertension: Secondary | ICD-10-CM

## 2017-08-21 DIAGNOSIS — F331 Major depressive disorder, recurrent, moderate: Secondary | ICD-10-CM

## 2017-08-21 DIAGNOSIS — F419 Anxiety disorder, unspecified: Secondary | ICD-10-CM

## 2017-08-21 DIAGNOSIS — R5383 Other fatigue: Secondary | ICD-10-CM

## 2017-08-21 DIAGNOSIS — R454 Irritability and anger: Secondary | ICD-10-CM

## 2017-08-21 DIAGNOSIS — F1910 Other psychoactive substance abuse, uncomplicated: Secondary | ICD-10-CM

## 2017-08-21 LAB — CBC WITH DIFFERENTIAL/PLATELET
BASOS PCT: 0.5 %
Basophils Absolute: 21 cells/uL (ref 0–200)
EOS PCT: 1.4 %
Eosinophils Absolute: 59 cells/uL (ref 15–500)
HCT: 48.4 % (ref 38.5–50.0)
Hemoglobin: 17.1 g/dL (ref 13.2–17.1)
Lymphs Abs: 1407 cells/uL (ref 850–3900)
MCH: 31.4 pg (ref 27.0–33.0)
MCHC: 35.3 g/dL (ref 32.0–36.0)
MCV: 89 fL (ref 80.0–100.0)
MPV: 9.1 fL (ref 7.5–12.5)
Monocytes Relative: 6.8 %
NEUTROS PCT: 57.8 %
Neutro Abs: 2428 cells/uL (ref 1500–7800)
PLATELETS: 159 10*3/uL (ref 140–400)
RBC: 5.44 10*6/uL (ref 4.20–5.80)
RDW: 12.8 % (ref 11.0–15.0)
TOTAL LYMPHOCYTE: 33.5 %
WBC mixed population: 286 cells/uL (ref 200–950)
WBC: 4.2 10*3/uL (ref 3.8–10.8)

## 2017-08-21 LAB — COMPREHENSIVE METABOLIC PANEL
AG Ratio: 2.1 (calc) (ref 1.0–2.5)
ALBUMIN MSPROF: 4.6 g/dL (ref 3.6–5.1)
ALKALINE PHOSPHATASE (APISO): 40 U/L (ref 40–115)
ALT: 103 U/L — ABNORMAL HIGH (ref 9–46)
AST: 50 U/L — AB (ref 10–40)
BILIRUBIN TOTAL: 0.8 mg/dL (ref 0.2–1.2)
BUN: 9 mg/dL (ref 7–25)
CALCIUM: 9.1 mg/dL (ref 8.6–10.3)
CHLORIDE: 102 mmol/L (ref 98–110)
CO2: 26 mmol/L (ref 20–32)
Creat: 0.92 mg/dL (ref 0.60–1.35)
GLOBULIN: 2.2 g/dL (ref 1.9–3.7)
Glucose, Bld: 119 mg/dL (ref 65–139)
POTASSIUM: 4 mmol/L (ref 3.5–5.3)
Sodium: 137 mmol/L (ref 135–146)
Total Protein: 6.8 g/dL (ref 6.1–8.1)

## 2017-08-21 LAB — TSH: TSH: 1.54 m[IU]/L (ref 0.40–4.50)

## 2017-08-21 MED ORDER — TRAZODONE HCL 50 MG PO TABS: 50.0000 mg | ORAL_TABLET | Freq: Every evening | ORAL | 1 refills | Status: DC | PRN

## 2017-08-21 MED ORDER — QUETIAPINE FUMARATE 25 MG PO TABS: 25.0000 mg | ORAL_TABLET | Freq: Every day | ORAL | 1 refills | Status: DC

## 2017-08-21 MED ORDER — ESCITALOPRAM OXALATE 20 MG PO TABS: 20.0000 mg | ORAL_TABLET | Freq: Every day | ORAL | 1 refills | Status: DC

## 2017-08-21 NOTE — Patient Instructions (Signed)
1. Continue lexapro 20 mg daily  2. Start quetiapine 25 mg at night 3. Discontinue ativan 4. Continue trazodone 50 mg at night as needed for sleep 5. Return to clinic in one month for 30 mins 6. Referral to thrapy 7. Referral to [facility] 8. Obtain blood test

## 2017-08-22 ENCOUNTER — Encounter (HOSPITAL_COMMUNITY): Payer: Self-pay | Admitting: Psychiatry

## 2017-09-02 ENCOUNTER — Telehealth (HOSPITAL_COMMUNITY): Payer: Self-pay | Admitting: Licensed Clinical Social Worker

## 2017-09-02 NOTE — Telephone Encounter (Signed)
Left VM instructing pt to call this writer back to discuss tx options, per Dr. Doy MinceHeisada.

## 2017-09-05 ENCOUNTER — Ambulatory Visit (HOSPITAL_COMMUNITY): Payer: Self-pay | Admitting: Licensed Clinical Social Worker

## 2017-09-17 NOTE — Progress Notes (Signed)
BH MD/PA/NP OP Progress Note  09/19/2017 10:42 AM Carlos Bond  MRN:  [id number]  Chief Complaint:  Chief Complaint    Depression; Anxiety; Follow-up; Drug / Alcohol Assessment     HPI:  Patient presents for follow-up appointment for alcohol use disorder and depression.  He states that he feels "significantly better" since the last appointment.  He noticed immediate benefit on insomnia after started quetiapine.  He feels more positive and even keeled. He has started to searching for a job, although he endorses fatigue and inattention. His wife told him that he is great at starting things but he is not good at finishing things; he thinks she has a point. He enjoys cooking; he may drink a glass of wine every week, although he denies any craving. He wants to drink a "good beer" rather than a pack of beers. He reports good relationship with his wife and his step daughter, age 42. He is not interested in [facility] anymore due to its location. He is looking forward to seeing a therapist. He denies insomnia. He feels fatigue. He denies SI. He denies anxiety. He denies panic attacks.   Visit Diagnosis:    ICD-10-CM   1. Alcohol use disorder, moderate, dependence (HCC) F10.20   2. Moderate episode of recurrent major depressive disorder (HCC) F33.1     Past Psychiatric History:  I have reviewed the patient's psychiatry history in detail and updated the patient record. Outpatient: for ADHD years ago,  Psychiatry admission: denies Previous suicide attempt: overdosed medication in 2015, Past trials of medication: sertraline, fluoxetine, Paxil, Effexor, duloxetine (worked well but could not afford it), Wellbutrin (limited effect), adderall (worsening anxiety), melatonin,  History of violence: denies (he was violent after overdosed on Ambien)    Past Medical History:  Past Medical History:  Diagnosis Date  . ADD (attention deficit disorder)   . Anxiety   . Complication of anesthesia    states woke  up during wisdom teeth extraction  . Depression   . Deviated nasal septum 09/2013  . GERD (gastroesophageal reflux disease)   . Hypertension    under control with med., has been on med. < 1 yr.  . Nasal turbinate hypertrophy 09/2013  . Sleep apnea   . Stuffy and runny nose 09/24/2013   clear drainage from nose    Past Surgical History:  Procedure Laterality Date  . INGUINAL HERNIA REPAIR     as a child  . NASAL SEPTOPLASTY WITH TURBINATE REDUCTION Bilateral 10/04/2013   Performed by Serena Colonelosen, Jefry, MD at [facility]   . WISDOM TOOTH EXTRACTION     as a teenager    Family Psychiatric History:  I have reviewed the patient's family history in detail and updated the patient record. Father- dementia, paternal sister- committed suicide in [id number]'s    Family History:  Family History  Problem Relation Age of Onset  . Stroke Father   . Diabetes Father   . Hypertension Father     Social History:  Social History   Socioeconomic History  . Marital status: Married    Spouse name: None  . Number of children: None  . Years of education: None  . Highest education level: None  Social Needs  . Financial resource strain: None  . Food insecurity - worry: None  . Food insecurity - inability: None  . Transportation needs - medical: None  . Transportation needs - non-medical: None  Occupational History  . None  Tobacco Use  . Smoking  status: Never Smoker  . Smokeless tobacco: Never Used  Substance and Sexual Activity  . Alcohol use: Yes    Comment: 2-3x / week, 07-22-2017 2x wkly beer/wine  . Drug use: No    Comment: 07-22-2017 per pt no  . Sexual activity: None  Other Topics Concern  . None  Social History Narrative  . None    Allergies: No Known Allergies  Metabolic Disorder Labs: No results found for: HGBA1C, MPG No results found for: PROLACTIN No results found for: CHOL, TRIG, HDL, CHOLHDL, VLDL, LDLCALC Lab Results  Component Value Date   TSH 1.54  08/21/2017    Therapeutic Level Labs: No results found for: LITHIUM No results found for: VALPROATE No components found for:  CBMZ  Current Medications: Current Outpatient Medications  Medication Sig Dispense Refill  . escitalopram (LEXAPRO) 20 MG tablet Take 1 tablet (20 mg total) by mouth daily. 30 tablet 1  . QUEtiapine (SEROQUEL) 25 MG tablet Take 1 tablet (25 mg total) by mouth at bedtime. 30 tablet 1  . traZODone (DESYREL) 50 MG tablet Take 1 tablet (50 mg total) by mouth at bedtime as needed for sleep. 30 tablet 1  . amphetamine-dextroamphetamine (ADDERALL) 20 MG tablet Take 20 mg by mouth 2 (two) times daily.     No current facility-administered medications for this visit.      Musculoskeletal: Strength & Muscle Tone: within normal limits Gait & Station: normal Patient leans: N/A  Psychiatric Specialty Exam: Review of Systems  Psychiatric/Behavioral: Positive for depression. Negative for hallucinations, memory loss, substance abuse and suicidal ideas. The patient is nervous/anxious. The patient does not have insomnia.   All other systems reviewed and are negative.   Blood pressure (!) 138/94, pulse 83, height 5\' 11"  (1.803 m), weight 297 lb (134.7 kg), SpO2 97 %.Body mass index is 41.42 kg/m.  General Appearance: Fairly Groomed  Eye Contact:  Good  Speech:  Clear and Coherent  Volume:  Normal  Mood:  "much better"  Affect:  Appropriate, Congruent and relaxed, less restricted  Thought Process:  Coherent and Goal Directed  Orientation:  Full (Time, Place, and Person)  Thought Content: Logical Perceptions: denies AH/VH  Suicidal Thoughts:  No  Homicidal Thoughts:  No  Memory:  Immediate;   Good Recent;   Good Remote;   Good  Judgement:  Good  Insight:  Fair  Psychomotor Activity:  Normal  Concentration:  Concentration: Good and Attention Span: Good  Recall:  Good  Fund of Knowledge: Good  Language: Good  Akathisia:  No  Handed:  Right  AIMS (if indicated):  not done  Assets:  Communication Skills Desire for Improvement  ADL's:  Intact  Cognition: WNL  Sleep:  Good on medication   Screenings:   Assessment and Plan:  Carlos Bond is a 41 y.o. year old male with a history of depression, anxiety, alcohol use disorder, history of misuse of opioids, hypertension, sleep apnea, who presents for follow up appointment for Alcohol use disorder, moderate, dependence (HCC)  Moderate episode of recurrent major depressive disorder (HCC)  # Alcohol use disorder, severe Patient cut down his alcohol use and denies any craving.  Patient is not interested in pharmacological treatment for alcohol abstinence.  Although he feels ambivalent about going to [facility], he agrees to see a therapist. Will continue motivational interview.   # MDD, moderate, recurrent without psychotic features #r/o alcohol induced mood disorder Patient reports significant improvement in his depression and anxiety since started on quetiapine and  abstinent from alcohol.  We will continue Lexapro for depression.  Will continue quetiapine as adjunctive treatment for depression and also to target insomnia and irritability.  Discussed metabolic side effect. Noted that he endorses fatigue and inattention; may consider adding Wellbutrin at the next encounter if any worsening in his symptoms. Will continue trazodone as needed for insomnia.   # Elevated LFT ALT/AST is elevated. He is advised to have follow up with his primacy care doctor.   Plan 1. Continue Lexapro 20 mg daily  2. Continue quetiapine 25 mg at night  3. Continue Trazodone 50 mg at night as needed for sleep 4. Return to clinic in one month for 30 mins 5. Check with your primary care regarding the recent blood test  The patient demonstrates the following risk factors for suicide: Chronic risk factors for suicide include: psychiatric disorder of depressionand completed suicide in a family member. Acute risk factorsfor suicide  include: family or marital conflict and unemployment. Protective factorsfor this patient include: positive social support, responsibility to others (children, family), coping skills and hope for the future. Considering these factors, the overall suicide risk at this point appears to be low. Patient isappropriate for outpatient follow up. His guns are locked at home.   The duration of this appointment visit was 30 minutes of face-to-face time with the patient.  Greater than 50% of this time was spent in counseling, explanation of  diagnosis, planning of further management, and coordination of care.  Neysa Hottereina Shreena Baines, MD 09/19/2017, 10:42 AM

## 2017-09-19 ENCOUNTER — Ambulatory Visit (HOSPITAL_COMMUNITY): Payer: Self-pay | Admitting: Psychiatry

## 2017-09-19 ENCOUNTER — Encounter (HOSPITAL_COMMUNITY): Payer: Self-pay | Admitting: Psychiatry

## 2017-09-19 VITALS — BP 138/94 | HR 83 | Ht 71.0 in | Wt 297.0 lb

## 2017-09-19 DIAGNOSIS — I10 Essential (primary) hypertension: Secondary | ICD-10-CM

## 2017-09-19 DIAGNOSIS — R450 Nervousness: Secondary | ICD-10-CM

## 2017-09-19 DIAGNOSIS — G4730 Sleep apnea, unspecified: Secondary | ICD-10-CM

## 2017-09-19 DIAGNOSIS — F331 Major depressive disorder, recurrent, moderate: Secondary | ICD-10-CM

## 2017-09-19 DIAGNOSIS — Z818 Family history of other mental and behavioral disorders: Secondary | ICD-10-CM

## 2017-09-19 DIAGNOSIS — F419 Anxiety disorder, unspecified: Secondary | ICD-10-CM

## 2017-09-19 DIAGNOSIS — Z810 Family history of intellectual disabilities: Secondary | ICD-10-CM

## 2017-09-19 DIAGNOSIS — F1020 Alcohol dependence, uncomplicated: Secondary | ICD-10-CM

## 2017-09-19 NOTE — Patient Instructions (Signed)
1. Continue lexapro 20 mg daily  2. Continue quetiapine 25 mg at night  3. Continue Trazodone 50 mg at night as needed for sleep 4. Return to clinic in one month for 30 mins 5. Check with your primary care regarding the recent blood test

## 2017-09-30 ENCOUNTER — Encounter (HOSPITAL_COMMUNITY): Payer: Self-pay | Admitting: Licensed Clinical Social Worker

## 2017-09-30 ENCOUNTER — Ambulatory Visit (HOSPITAL_COMMUNITY): Payer: Self-pay | Admitting: Licensed Clinical Social Worker

## 2017-09-30 DIAGNOSIS — F331 Major depressive disorder, recurrent, moderate: Secondary | ICD-10-CM

## 2017-09-30 DIAGNOSIS — F1020 Alcohol dependence, uncomplicated: Secondary | ICD-10-CM
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
## 2017-10-16 ENCOUNTER — Ambulatory Visit (HOSPITAL_COMMUNITY): Payer: Self-pay | Admitting: Psychiatry

## 2017-10-21 ENCOUNTER — Encounter (HOSPITAL_COMMUNITY): Payer: Self-pay | Admitting: Licensed Clinical Social Worker

## 2017-10-21 ENCOUNTER — Ambulatory Visit (HOSPITAL_COMMUNITY): Payer: Self-pay | Admitting: Licensed Clinical Social Worker

## 2017-10-21 DIAGNOSIS — F331 Major depressive disorder, recurrent, moderate: Secondary | ICD-10-CM
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
## 2017-11-07 NOTE — Progress Notes (Signed)
BH MD/PA/NP OP Progress Note  11/11/2017 9:19 AM Carlos Bond  MRN:  [id number]  Chief Complaint:  Chief Complaint    Follow-up; Depression; Anxiety; Alcohol Problem     HPI:  Patient presents for follow-up appointment for alcohol use and depression.  He states that he feels less depressed since the last encounter.  However, he feels more anxious.  He states that thinking of job interview makes him feel like vomiting.  It reminds him that he used to have severe social anxiety as a child and he vomited before going on a date.  He feels like he is "on the spot" and is judged by other people. He was told by his wife at the restaurant to "be relax." He tends to stay in the house as he is afraid that his neighbor might be looking at him.  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 He  used to enjoy the work as he felt  more confident as he did. He feels he is getting backwards. He agrees that he might try finding out some volunteer work which he might be interested/engaged. He also agrees to start taking a walk in the park.  He reports good relationship with his wife.  He has insomnia with racing thoughts.  He feels more motivated and less fatigue.  He has increased appetite.  He denies SI.  He feels anxious and tense.  He has panic attacks.  He has difficulty with attention.  He drinks 6 pack of beer per week.  He denies craving or using alcohol as an eye opener. He denies drug use.    Wt Readings from Last 3 Encounters:  11/11/17 297 lb (134.7 kg)  09/19/17 297 lb (134.7 kg)  08/21/17 288 lb 6.4 oz (130.8 kg)    Visit Diagnosis:    ICD-10-CM   1. Moderate episode of recurrent major depressive disorder (HCC) F33.1   2. Alcohol use disorder, moderate, dependence (HCC) F10.20     Past Psychiatric History:  I have reviewed the patient's psychiatry history in detail and updated the patient record. Outpatient: for ADHD years ago,  Psychiatry admission:  denies Previous suicide attempt: overdosed medication in 2015, Past trials of medication: sertraline, fluoxetine, Paxil, Effexor, duloxetine (worked well but could not afford it), Wellbutrin (limited effect), adderall (worsening anxiety),melatonin,Trazodone, Adderall (anxiety) History of violence: denies (he was violent after overdosed on Ambien)  Past Medical History:  Past Medical History:  Diagnosis Date  . ADD (attention deficit disorder)   . Anxiety   . Complication of anesthesia    states woke up during wisdom teeth extraction  . Depression   . Deviated nasal septum 09/2013  . GERD (gastroesophageal reflux disease)   . Hypertension    under control with med., has been on med. < 1 yr.  . Nasal turbinate hypertrophy 09/2013  . Sleep apnea   . Stuffy and runny nose 09/24/2013   clear drainage from nose    Past Surgical History:  Procedure Laterality Date  . INGUINAL HERNIA REPAIR     as a child  . NASAL SEPTOPLASTY W/ TURBINOPLASTY Bilateral 10/04/2013   Procedure: NASAL SEPTOPLASTY WITH TURBINATE REDUCTION;  Surgeon: Serena Colonel, MD;  Location: [facility];  Service: ENT;  Laterality: Bilateral;  . WISDOM TOOTH EXTRACTION     as a teenager    Family Psychiatric History: I have reviewed the patient's family history in detail and updated the  patient record. Father- dementia, paternal sister- committed suicide in 50's    Family History:  Family History  Problem Relation Age of Onset  . Stroke Father   . Diabetes Father   . Hypertension Father     Social History:  Social History   Socioeconomic History  . Marital status: Married    Spouse name: None  . Number of children: None  . Years of education: None  . Highest education level: None  Social Needs  . Financial resource strain: None  . Food insecurity - worry: None  . Food insecurity - inability: None  . Transportation needs - medical: None  . Transportation needs - non-medical: None   Occupational History  . None  Tobacco Use  . Smoking status: Never Smoker  . Smokeless tobacco: Never Used  Substance and Sexual Activity  . Alcohol use: Yes    Comment: 2-3x / week, 07-22-2017 2x wkly beer/wine  . Drug use: No    Comment: 07-22-2017 per pt no  . Sexual activity: None  Other Topics Concern  . None  Social History Narrative  . None    Allergies: No Known Allergies  Metabolic Disorder Labs: No results found for: HGBA1C, MPG No results found for: PROLACTIN No results found for: CHOL, TRIG, HDL, CHOLHDL, VLDL, LDLCALC Lab Results  Component Value Date   TSH 1.54 08/21/2017    Therapeutic Level Labs: No results found for: LITHIUM No results found for: VALPROATE No components found for:  CBMZ  Current Medications: Current Outpatient Medications  Medication Sig Dispense Refill  . escitalopram (LEXAPRO) 20 MG tablet Take 1 tablet (20 mg total) by mouth daily. 30 tablet 1  . QUEtiapine (SEROQUEL) 50 MG tablet Take 1 tablet (50 mg total) by mouth at bedtime. 30 tablet 1   No current facility-administered medications for this visit.      Musculoskeletal: Strength & Muscle Tone: within normal limits Gait & Station: normal Patient leans: N/A  Psychiatric Specialty Exam: Review of Systems  Psychiatric/Behavioral: Positive for depression. Negative for hallucinations, memory loss, substance abuse and suicidal ideas. The patient is nervous/anxious and has insomnia.   All other systems reviewed and are negative.   Blood pressure (!) 144/81, pulse 80, height 5\' 11"  (1.803 m), weight 297 lb (134.7 kg), SpO2 97 %.Body mass index is 41.42 kg/m.  General Appearance: Fairly Groomed  Eye Contact:  Good  Speech:  Clear and Coherent  Volume:  Normal  Mood:  Anxious  Affect:  Appropriate, Congruent and slightly restricted, tense  Thought Process:  Coherent and Goal Directed  Orientation:  Full (Time, Place, and Person)  Thought Content: Logical   Suicidal  Thoughts:  No  Homicidal Thoughts:  No  Memory:  Immediate;   Good Recent;   Good Remote;   Good  Judgement:  Good  Insight:  Fair  Psychomotor Activity:  Normal  Concentration:  Concentration: Good and Attention Span: Good  Recall:  Good  Fund of Knowledge: Good  Language: Good  Akathisia:  No  Handed:  Right  AIMS (if indicated): not done  Assets:  Communication Skills Desire for Improvement  ADL's:  Intact  Cognition: WNL  Sleep:  Poor   Screenings:   Assessment and Plan:  Carlos Bond is a 42 y.o. year old male with a history of depression, anxiety, alcohol use disorder, history of misuse of opioids, hypertension, sleep apnea , who presents for follow up appointment for Moderate episode of recurrent major depressive disorder (HCC)  Alcohol use disorder, moderate, dependence (HCC)  # Alcohol use disorder, severe Patient cut down his alcohol use and denies any craving.  He is not interested in pharmacological treatment for alcohol abstinence.  We will continue motivational interview.   # MDD, moderate, recurrent without psychotic features # r/o alcohol induced mood disorder Patient endorses worsening anxiety and insomnia in the setting of finding a job.  Will continue Lexapro to target depression and anxiety.  Will uptitrate quetiapine as adjunctive treatment for depression and for insomnia.  Discussed metabolic side effect.  We will continue to monitor weight gain.  Explored his value.  Discussed behavioral activation.  He is encouraged to continue to see his therapist.   # r/o ADHD Patient states that he had formal test done in [place] in mid 2000's. It is discussed that we would first prioritize treatment for mood given stimulant can exacerbate anxiety. He agrees with plan.  # Elevated LFT ALT/AST is elevated. He is advised to have follow up with his primacy care doctor.   Plan 1. Continue Lexapro 20 mg daily  2. Increase quetiapine 50 mg at night  3. Discontinue  Trazodone 4. Return to clinic in two months for 30 mins 5. Check with your primary care regarding the recent blood test - Will consider obtaining record for ADHD evaluation at the next visit.    The patient demonstrates the following risk factors for suicide: Chronic risk factors for suicide include: psychiatric disorder of depressionand completed suicide in a family member. Acute risk factorsfor suicide include: family or marital conflict and unemployment. Protective factorsfor this patient include: positive social support, responsibility to others (children, family), coping skills and hope for the future. Considering these factors, the overall suicide risk at this point appears to be low. Patient isappropriate for outpatient follow up.His guns are locked at home.  The duration of this appointment visit was 30 minutes of face-to-face time with the patient.  Greater than 50% of this time was spent in counseling, explanation of  diagnosis, planning of further management, and coordination of care.   Neysa Hottereina Aeron Lheureux, MD 11/11/2017, 9:19 AM

## 2017-11-11 ENCOUNTER — Ambulatory Visit (INDEPENDENT_AMBULATORY_CARE_PROVIDER_SITE_OTHER): Payer: Self-pay | Admitting: Psychiatry

## 2017-11-11 ENCOUNTER — Encounter (HOSPITAL_COMMUNITY): Payer: Self-pay | Admitting: Psychiatry

## 2017-11-11 VITALS — BP 144/81 | HR 80 | Ht 71.0 in | Wt 297.0 lb

## 2017-11-11 DIAGNOSIS — Z818 Family history of other mental and behavioral disorders: Secondary | ICD-10-CM

## 2017-11-11 DIAGNOSIS — F331 Major depressive disorder, recurrent, moderate: Secondary | ICD-10-CM

## 2017-11-11 DIAGNOSIS — G4700 Insomnia, unspecified: Secondary | ICD-10-CM

## 2017-11-11 DIAGNOSIS — R450 Nervousness: Secondary | ICD-10-CM

## 2017-11-11 DIAGNOSIS — Z810 Family history of intellectual disabilities: Secondary | ICD-10-CM

## 2017-11-11 DIAGNOSIS — F419 Anxiety disorder, unspecified: Secondary | ICD-10-CM

## 2017-11-11 DIAGNOSIS — F1020 Alcohol dependence, uncomplicated: Secondary | ICD-10-CM

## 2017-11-11 MED ORDER — QUETIAPINE FUMARATE 50 MG PO TABS: 50.0000 mg | ORAL_TABLET | Freq: Every day | ORAL | 1 refills | Status: AC

## 2017-11-11 MED ORDER — ESCITALOPRAM OXALATE 20 MG PO TABS: 20.0000 mg | ORAL_TABLET | Freq: Every day | ORAL | 1 refills | Status: AC

## 2017-11-11 NOTE — Patient Instructions (Signed)
1. Continue Lexapro 20 mg daily  2. Increase quetiapine 50 mg at night  3. Discontinue Trazodone 4. Return to clinic in two months for 30 mins 5. Check with your primary care regarding the recent blood test

## 2017-11-16 ENCOUNTER — Other Ambulatory Visit: Payer: Self-pay

## 2017-11-16 ENCOUNTER — Emergency Department (HOSPITAL_COMMUNITY): Payer: Self-pay

## 2017-11-16 ENCOUNTER — Encounter (HOSPITAL_COMMUNITY): Payer: Self-pay | Admitting: *Deleted

## 2017-11-16 ENCOUNTER — Emergency Department (HOSPITAL_COMMUNITY)
Admission: EM | Admit: 2017-11-16 | Discharge: 2017-11-17 | Disposition: A | Discharge status: Home / Self Care | Payer: Self-pay | Attending: Emergency Medicine | Admitting: Emergency Medicine

## 2017-11-16 DIAGNOSIS — R1011 Right upper quadrant pain: Secondary | ICD-10-CM | POA: Insufficient documentation

## 2017-11-16 DIAGNOSIS — Z79899 Other long term (current) drug therapy: Secondary | ICD-10-CM | POA: Insufficient documentation

## 2017-11-16 DIAGNOSIS — R1013 Epigastric pain: Secondary | ICD-10-CM | POA: Insufficient documentation

## 2017-11-16 DIAGNOSIS — R112 Nausea with vomiting, unspecified: Secondary | ICD-10-CM | POA: Insufficient documentation

## 2017-11-16 DIAGNOSIS — I10 Essential (primary) hypertension: Secondary | ICD-10-CM | POA: Insufficient documentation

## 2017-11-16 MED ORDER — IOPAMIDOL (ISOVUE-300) INJECTION 61%
100.0000 mL | Freq: Once | INTRAVENOUS | Status: AC | PRN
  Administered 2017-11-17: 100 mL via INTRAVENOUS

## 2017-11-16 MED ORDER — FAMOTIDINE IN NACL 20-0.9 MG/50ML-% IV SOLN
20.0000 mg | Freq: Once | INTRAVENOUS | Status: AC
  Administered 2017-11-16: 20 mg via INTRAVENOUS
  Filled 2017-11-16: qty 50

## 2017-11-16 MED ORDER — HYDROMORPHONE HCL 1 MG/ML IJ SOLN
1.0000 mg | Freq: Once | INTRAMUSCULAR | Status: AC
  Administered 2017-11-16: 1 mg via INTRAVENOUS
  Filled 2017-11-16: qty 1

## 2017-11-16 MED ORDER — SODIUM CHLORIDE 0.9 % IV BOLUS (SEPSIS)
500.0000 mL | Freq: Once | INTRAVENOUS | Status: AC
  Administered 2017-11-16: 500 mL via INTRAVENOUS

## 2017-11-16 MED ORDER — MORPHINE SULFATE (PF) 4 MG/ML IV SOLN
4.0000 mg | Freq: Once | INTRAVENOUS | Status: AC
  Administered 2017-11-17: 4 mg via INTRAVENOUS
  Filled 2017-11-16: qty 1

## 2017-11-16 NOTE — ED Triage Notes (Signed)
Pt c/o ruq pain and n/v that started x 3 hours ago

## 2017-11-16 NOTE — ED Provider Notes (Signed)
[facility] EMERGENCY DEPARTMENT Provider Note   CSN: [id number] Arrival date & time: 11/16/17  2318     History   Chief Complaint Chief Complaint  Patient presents with  . Abdominal Pain    HPI Carlos Bond is a 42 y.o. male.  Patient presents to the emergency department for evaluation of epigastric and right upper quadrant abdominal pain.  Symptoms began 3 hours ago, right after eating dinner.  He reports that he had a similar episode about a year ago, but this is not frequent.  He did have one episode of emesis.  Pain is constant and severe and goes into his back.  No diarrhea associated.      Past Medical History:  Diagnosis Date  . ADD (attention deficit disorder)   . Anxiety   . Complication of anesthesia    states woke up during wisdom teeth extraction  . Depression   . Deviated nasal septum 09/2013  . GERD (gastroesophageal reflux disease)   . Hypertension    under control with med., has been on med. < 1 yr.  . Nasal turbinate hypertrophy 09/2013  . Sleep apnea   . Stuffy and runny nose 09/24/2013   clear drainage from nose    Patient Active Problem List   Diagnosis Date Noted  . Alcohol use disorder, moderate, dependence (HCC) 11/11/2017  . Moderate episode of recurrent major depressive disorder (HCC) 07/22/2017    Past Surgical History:  Procedure Laterality Date  . INGUINAL HERNIA REPAIR     as a child  . NASAL SEPTOPLASTY W/ TURBINOPLASTY Bilateral 10/04/2013   Procedure: NASAL SEPTOPLASTY WITH TURBINATE REDUCTION;  Surgeon: Serena Colonel, MD;  Location: [facility];  Service: ENT;  Laterality: Bilateral;  . WISDOM TOOTH EXTRACTION     as a teenager       Home Medications    Prior to Admission medications   Medication Sig Start Date End Date Taking? Authorizing Provider  escitalopram (LEXAPRO) 20 MG tablet Take 1 tablet (20 mg total) by mouth daily. 11/11/17   Neysa Hotter, MD  oxyCODONE-acetaminophen (PERCOCET) 5-325 MG tablet  Take 1-2 tablets by mouth every 4 (four) hours as needed. 11/17/17   Gilda Crease, MD  QUEtiapine (SEROQUEL) 50 MG tablet Take 1 tablet (50 mg total) by mouth at bedtime. 11/11/17   Neysa Hotter, MD  ranitidine (ZANTAC) 150 MG tablet Take 1 tablet (150 mg total) by mouth 2 (two) times daily. 11/17/17   Gilda Crease, MD    Family History Family History  Problem Relation Age of Onset  . Stroke Father   . Diabetes Father   . Hypertension Father     Social History Social History   Tobacco Use  . Smoking status: Never Smoker  . Smokeless tobacco: Never Used  Substance Use Topics  . Alcohol use: Yes    Comment: 2-3x / week, 07-22-2017 2x wkly beer/wine  . Drug use: No    Comment: 07-22-2017 per pt no     Allergies   Patient has no known allergies.   Review of Systems Review of Systems  Gastrointestinal: Positive for abdominal pain, nausea and vomiting.  All other systems reviewed and are negative.    Physical Exam Updated Vital Signs BP (!) 171/111 (BP Location: Right Arm)   Pulse 93   Temp 97.7 F (36.5 C) (Oral)   Resp 20   Ht 5\' 9"  (1.753 m)   Wt 127 kg (280 lb)   SpO2 96%  BMI 41.35 kg/m   Physical Exam  Constitutional: He is oriented to person, place, and time. He appears well-developed and well-nourished. No distress.  HENT:  Head: Normocephalic and atraumatic.  Right Ear: Hearing normal.  Left Ear: Hearing normal.  Nose: Nose normal.  Mouth/Throat: Oropharynx is clear and moist and mucous membranes are normal.  Eyes: Conjunctivae and EOM are normal. Pupils are equal, round, and reactive to light.  Neck: Normal range of motion. Neck supple.  Cardiovascular: Regular rhythm, S1 normal and S2 normal. Exam reveals no gallop and no friction rub.  No murmur heard. Pulmonary/Chest: Effort normal and breath sounds normal. No respiratory distress. He exhibits no tenderness.  Abdominal: Soft. Normal appearance and bowel sounds are normal. There is  no hepatosplenomegaly. There is tenderness in the right upper quadrant and epigastric area. There is no rebound, no guarding, no tenderness at McBurney's point and negative Murphy's sign. No hernia.  Musculoskeletal: Normal range of motion.  Neurological: He is alert and oriented to person, place, and time. He has normal strength. No cranial nerve deficit or sensory deficit. Coordination normal. GCS eye subscore is 4. GCS verbal subscore is 5. GCS motor subscore is 6.  Skin: Skin is warm, dry and intact. No rash noted. No cyanosis.  Psychiatric: He has a normal mood and affect. His speech is normal and behavior is normal. Thought content normal.  Nursing note and vitals reviewed.    ED Treatments / Results  Labs (all labs ordered are listed, but only abnormal results are displayed) Labs Reviewed  CBC WITH DIFFERENTIAL/PLATELET - Abnormal; Notable for the following components:      Result Value   MCHC 36.1 (*)    All other components within normal limits  COMPREHENSIVE METABOLIC PANEL - Abnormal; Notable for the following components:   CO2 21 (*)    AST 71 (*)    ALT 118 (*)    All other components within normal limits  LIPASE, BLOOD    EKG  EKG Interpretation None       Radiology Ct Abdomen Pelvis W Contrast  Result Date: 11/17/2017 CLINICAL DATA:  42 year old male with abdominal pain. EXAM: CT ABDOMEN AND PELVIS WITH CONTRAST TECHNIQUE: Multidetector CT imaging of the abdomen and pelvis was performed using the standard protocol following bolus administration of intravenous contrast. CONTRAST:  ISOVUE-300 IOPAMIDOL (ISOVUE-300) INJECTION 61% COMPARISON:  CT of the abdomen pelvis dated 10/14/2010 FINDINGS: Lower chest: The visualized lung bases are clear. No intra-free air or free fluid. Hepatobiliary: Diffuse fatty infiltration of the liver. No intrahepatic biliary ductal dilatation. The gallbladder is physiologically distended and appears unremarkable. Pancreas:  Unremarkable. No pancreatic ductal dilatation or surrounding inflammatory changes. Spleen: Normal in size without focal abnormality. Adrenals/Urinary Tract: Adrenal glands are unremarkable. Kidneys are normal, without renal calculi, focal lesion, or hydronephrosis. Bladder is unremarkable. 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 There is no bowel obstruction or active inflammation. Normal appendix. Vascular/Lymphatic: No significant vascular findings are present. No enlarged abdominal or pelvic lymph nodes. Reproductive: The prostate and seminal vesicles are grossly unremarkable. Other: Small fat containing left inguinal and umbilical hernias. Musculoskeletal: No acute or significant osseous findings. IMPRESSION: 1. No acute intra-abdominopelvic pathology. 2. Fatty liver. 3. 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 Electronically Signed   By: Elgie Collard M.D.   On: 11/17/2017 01:02    Procedures Procedures (including critical care time)  Medications Ordered in ED Medications  sodium chloride 0.9 %  bolus 500 mL (0 mLs Intravenous Stopped 11/17/17 0032)  morphine 4 MG/ML injection 4 mg (4 mg Intravenous Given 11/17/17 0011)  HYDROmorphone (DILAUDID) injection 1 mg (1 mg Intravenous Given 11/16/17 2354)  famotidine (PEPCID) IVPB 20 mg premix (0 mg Intravenous Stopped 11/17/17 0032)  iopamidol (ISOVUE-300) 61 % injection 100 mL (100 mLs Intravenous Contrast Given 11/17/17 0046)     Initial Impression / Assessment and Plan / ED Course  I have reviewed the triage vital signs and the nursing notes.  Pertinent labs & imaging results that were available during my care of the patient were reviewed by me and considered in my medical decision making (see chart for details).     Presented with severe colicky epigastric pain after eating.  The pain has resolved after treatment with IV Dilaudid.  Labs revealed normal white blood cell count with  very slightly elevated AST and ALT with normal bilirubin.  A CAT scan was performed does not show any acute abnormality.  I discussed this with the patient.  This does not completely rule out gallstone, but there is no evidence of cholecystitis at this time.  As his pain is resolved he is appropriate for discharge and return tomorrow for outpatient ultrasound to rule out cholelithiasis.  Patient is in agreement with this plan.  He was given instructions for follow-up with gastroenterology in the event that the ultrasound is negative.  If the ultrasound does show gallstones, will require follow-up with general surgery instead.  Final Clinical Impressions(s) / ED Diagnoses   Final diagnoses:  Epigastric pain    ED Discharge Orders        Ordered    oxyCODONE-acetaminophen (PERCOCET) 5-325 MG tablet  Every 4 hours PRN     11/17/17 0117    ranitidine (ZANTAC) 150 MG tablet  2 times daily     11/17/17 0118    US Abdomen Limited RUQ/Gall Gladder     11/17/17 0118       Gilda CreasePollina, Myeisha Kruser J, MD 11/17/17 [phone]

## 2017-11-17 ENCOUNTER — Other Ambulatory Visit: Payer: Self-pay

## 2017-11-17 ENCOUNTER — Ambulatory Visit (HOSPITAL_COMMUNITY)
Admission: RE | Admit: 2017-11-17 | Discharge: 2017-11-17 | Disposition: A | Discharge status: Home / Self Care | Payer: Self-pay | Source: Ambulatory Visit | Attending: Emergency Medicine | Admitting: Emergency Medicine

## 2017-11-17 DIAGNOSIS — R1011 Right upper quadrant pain: Secondary | ICD-10-CM | POA: Insufficient documentation

## 2017-11-17 DIAGNOSIS — K760 Fatty (change of) liver, not elsewhere classified: Secondary | ICD-10-CM

## 2017-11-17 DIAGNOSIS — K828 Other specified diseases of gallbladder: Secondary | ICD-10-CM | POA: Insufficient documentation

## 2017-11-17 LAB — CBC WITH DIFFERENTIAL/PLATELET
BASOS ABS: 0 10*3/uL (ref 0.0–0.1)
BASOS PCT: 0 %
EOS PCT: 1 %
Eosinophils Absolute: 0.1 10*3/uL (ref 0.0–0.7)
HCT: 46 % (ref 39.0–52.0)
Hemoglobin: 16.6 g/dL (ref 13.0–17.0)
LYMPHS PCT: 40 %
Lymphs Abs: 2.1 10*3/uL (ref 0.7–4.0)
MCH: 31.9 pg (ref 26.0–34.0)
MCHC: 36.1 g/dL — AB (ref 30.0–36.0)
MCV: 88.5 fL (ref 78.0–100.0)
MONO ABS: 0.5 10*3/uL (ref 0.1–1.0)
MONOS PCT: 9 %
Neutro Abs: 2.6 10*3/uL (ref 1.7–7.7)
Neutrophils Relative %: 50 %
PLATELETS: 155 10*3/uL (ref 150–400)
RBC: 5.2 MIL/uL (ref 4.22–5.81)
RDW: 12.4 % (ref 11.5–15.5)
WBC: 5.3 10*3/uL (ref 4.0–10.5)

## 2017-11-17 LAB — COMPREHENSIVE METABOLIC PANEL
ALT: 118 U/L — ABNORMAL HIGH (ref 17–63)
ANION GAP: 13 (ref 5–15)
AST: 71 U/L — AB (ref 15–41)
Albumin: 4.7 g/dL (ref 3.5–5.0)
Alkaline Phosphatase: 45 U/L (ref 38–126)
BUN: 9 mg/dL (ref 6–20)
CHLORIDE: 107 mmol/L (ref 101–111)
CO2: 21 mmol/L — ABNORMAL LOW (ref 22–32)
Calcium: 9.2 mg/dL (ref 8.9–10.3)
Creatinine, Ser: 0.78 mg/dL (ref 0.61–1.24)
GFR calc Af Amer: >60 mL/min (ref >60)
Glucose, Bld: 95 mg/dL (ref 65–99)
POTASSIUM: 4 mmol/L (ref 3.5–5.1)
Sodium: 141 mmol/L (ref 135–145)
TOTAL PROTEIN: 7.6 g/dL (ref 6.5–8.1)
Total Bilirubin: 0.9 mg/dL (ref 0.3–1.2)

## 2017-11-17 LAB — LIPASE, BLOOD: LIPASE: 35 U/L (ref 11–51)

## 2017-11-17 MED ORDER — OXYCODONE-ACETAMINOPHEN 5-325 MG PO TABS: 1.0000 | ORAL_TABLET | ORAL | 0 refills | Status: AC | PRN

## 2017-11-17 MED ORDER — RANITIDINE HCL 150 MG PO TABS: 150.0000 mg | ORAL_TABLET | Freq: Two times a day (BID) | ORAL | 0 refills | Status: AC

## 2017-11-17 NOTE — ED Provider Notes (Signed)
Discussed ultrasound results with the patient and his wife.  He has a distended gallbladder with sludge.  Rx Zofran 8 mg ODT (#15).  Referral to general surgeon Dr. Eustaquio MaizeMark Jenkins   Carlos Steffenhagen, MD 11/17/17 1140

## 2017-11-18 MED FILL — Oxycodone w/ Acetaminophen Tab 5-325 MG: ORAL | Qty: 6 | Status: AC

## 2017-11-19 ENCOUNTER — Ambulatory Visit (HOSPITAL_COMMUNITY): Payer: Self-pay | Admitting: Licensed Clinical Social Worker

## 2017-11-24 ENCOUNTER — Encounter: Payer: Self-pay | Admitting: Internal Medicine

## 2018-01-01 ENCOUNTER — Ambulatory Visit: Payer: Self-pay | Admitting: Nurse Practitioner

## 2018-01-07 NOTE — Progress Notes (Deleted)
BH MD/PA/NP OP Progress Note  01/07/2018 10:44 AM Carlos Bond  MRN:  [id number]  Chief Complaint:  HPI: *** Visit Diagnosis: No diagnosis found.  Past Psychiatric History:  I have reviewed the patient's psychiatry history in detail and updated the patient record. Outpatient: for ADHD years ago,  Psychiatry admission: denies Previous suicide attempt: overdosed medication in 2015, Past trials of medication: sertraline, fluoxetine, Paxil, Effexor, duloxetine (worked well but could not afford it),Wellbutrin (limited effect),adderall (worsening anxiety),melatonin,Trazodone, Adderall (anxiety) History of violence: denies (he was violent after overdosed on Ambien)    Past Medical History:  Past Medical History:  Diagnosis Date  . ADD (attention deficit disorder)   . Anxiety   . Complication of anesthesia    states woke up during wisdom teeth extraction  . Depression   . Deviated nasal septum 09/2013  . GERD (gastroesophageal reflux disease)   . Hypertension    under control with med., has been on med. < 1 yr.  . Nasal turbinate hypertrophy 09/2013  . Sleep apnea   . Stuffy and runny nose 09/24/2013   clear drainage from nose    Past Surgical History:  Procedure Laterality Date  . INGUINAL HERNIA REPAIR     as a child  . NASAL SEPTOPLASTY W/ TURBINOPLASTY Bilateral 10/04/2013   Procedure: NASAL SEPTOPLASTY WITH TURBINATE REDUCTION;  Surgeon: Serena Colonel, MD;  Location: [facility];  Service: ENT;  Laterality: Bilateral;  . WISDOM TOOTH EXTRACTION     as a teenager    Family Psychiatric History: I have reviewed the patient's family history in detail and updated the patient record. Father- dementia, paternal sister- committed suicide in 58's   Family History:  Family History  Problem Relation Age of Onset  . Stroke Father   . Diabetes Father   . Hypertension Father     Social History:  Social History   Socioeconomic History  . Marital status:  Married    Spouse name: Not on file  . Number of children: Not on file  . Years of education: Not on file  . Highest education level: Not on file  Social Needs  . Financial resource strain: Not on file  . Food insecurity - worry: Not on file  . Food insecurity - inability: Not on file  . Transportation needs - medical: Not on file  . Transportation needs - non-medical: Not on file  Occupational History  . Not on file  Tobacco Use  . Smoking status: Never Smoker  . Smokeless tobacco: Never Used  Substance and Sexual Activity  . Alcohol use: Yes    Comment: 2-3x / week, 07-22-2017 2x wkly beer/wine  . Drug use: No    Comment: 07-22-2017 per pt no  . Sexual activity: Not on file  Other Topics Concern  . Not on file  Social History Narrative  . Not on file    Allergies: No Known Allergies  Metabolic Disorder Labs: No results found for: HGBA1C, MPG No results found for: PROLACTIN No results found for: CHOL, TRIG, HDL, CHOLHDL, VLDL, LDLCALC Lab Results  Component Value Date   TSH 1.54 08/21/2017    Therapeutic Level Labs: No results found for: LITHIUM No results found for: VALPROATE No components found for:  CBMZ  Current Medications: Current Outpatient Medications  Medication Sig Dispense Refill  . escitalopram (LEXAPRO) 20 MG tablet Take 1 tablet (20 mg total) by mouth daily. 30 tablet 1  . oxyCODONE-acetaminophen (PERCOCET) 5-325 MG tablet Take 1-2 tablets by  mouth every 4 (four) hours as needed. 6 tablet 0  . QUEtiapine (SEROQUEL) 50 MG tablet Take 1 tablet (50 mg total) by mouth at bedtime. 30 tablet 1  . ranitidine (ZANTAC) 150 MG tablet Take 1 tablet (150 mg total) by mouth 2 (two) times daily. 60 tablet 0   No current facility-administered medications for this visit.      Musculoskeletal: Strength & Muscle Tone: within normal limits Gait & Station: normal Patient leans: N/A  Psychiatric Specialty Exam: ROS  There were no vitals taken for this  visit.There is no height or weight on file to calculate BMI.  General Appearance: Fairly Groomed  Eye Contact:  Good  Speech:  Clear and Coherent  Volume:  Normal  Mood:  {BHH MOOD:22306}  Affect:  {Affect (PAA):22687}  Thought Process:  Coherent and Goal Directed  Orientation:  Full (Time, Place, and Person)  Thought Content: Logical   Suicidal Thoughts:  {ST/HT (PAA):22692}  Homicidal Thoughts:  {ST/HT (PAA):22692}  Memory:  Immediate;   Good Recent;   Good Remote;   Good  Judgement:  {Judgement (PAA):22694}  Insight:  {Insight (PAA):22695}  Psychomotor Activity:  Normal  Concentration:  Concentration: Good and Attention Span: Good  Recall:  Good  Fund of Knowledge: Good  Language: Good  Akathisia:  No  Handed:  Right  AIMS (if indicated): not done  Assets:  Communication Skills Desire for Improvement  ADL's:  Intact  Cognition: WNL  Sleep:  {BHH GOOD/FAIR/POOR:22877}   Screenings:   Assessment and Plan:  Carlos Bond is a 42 y.o. year old male with a history of alcohol use disorder, depression, anxiety,  history of misuse of opioids,hypertension, sleep apnea, who presents for follow up appointment for No diagnosis found.  # Alcohol use disorder, severe  Patient cut down his alcohol use and denies any craving.  He is not interested in pharmacological treatment for alcohol abstinence.  We will continue motivational interview.   # MDD, moderate, recurrent without psychotic features # r/o alcohol induced mood disorder Patient endorses worsening anxiety and insomnia in the setting of finding a job.  Will continue Lexapro to target depression and anxiety.  Will uptitrate quetiapine as adjunctive treatment for depression and for insomnia.  Discussed metabolic side effect.  We will continue to monitor weight gain.  Explored his value.  Discussed behavioral activation.  He is encouraged to continue to see his therapist.   # r/o ADHD Patient states that he had formal test  done in [place] in mid 2000's. It is discussed that we would first prioritize treatment for mood given stimulant can exacerbate anxiety. He agrees with plan.   # Elevated LFT ALT/AST is elevated. He is advised to have follow up with his [facility] care doctor.  Plan 1. Continue Lexapro 20 mg daily  2. Increase quetiapine 50 mg at night  3. Discontinue Trazodone 4. Return to clinic in two months for 30 mins 5. Check with your primary care regarding the recent blood test - Will consider obtaining record for ADHD evaluation at the next visit.    The patient demonstrates the following risk factors for suicide: Chronic risk factors for suicide include: psychiatric disorder of depressionand completed suicide in a family member. Acute risk factorsfor suicide include: family or marital conflict and unemployment. Protective factorsfor this patient include: positive social support, responsibility to others (children, family), coping skills and hope for the future. Considering these factors, the overall suicide risk at this point appears to be low. Patient  isappropriate for outpatient follow up.His guns are locked at home.   Neysa Hottereina Davelle Anselmi, MD 01/07/2018, 10:44 AM

## 2018-01-12 ENCOUNTER — Ambulatory Visit (HOSPITAL_COMMUNITY): Payer: Self-pay | Admitting: Psychiatry

## 2018-12-08 IMAGING — US US ABDOMEN LIMITED
1 series · 14 of 25 positions shown · non-contrast
Comparison: 11/17/2017 CT.  04/05/2011 ultrasound.

CLINICAL DATA: 41-year-old male with right upper quadrant pain
after eating for the past day. Subsequent encounter.

EXAM:
ULTRASOUND ABDOMEN LIMITED RIGHT UPPER QUADRANT

[Series 1: us abdomen limited · 0.27mm/px · 14 of 79 slices shown]
[im 1/79]
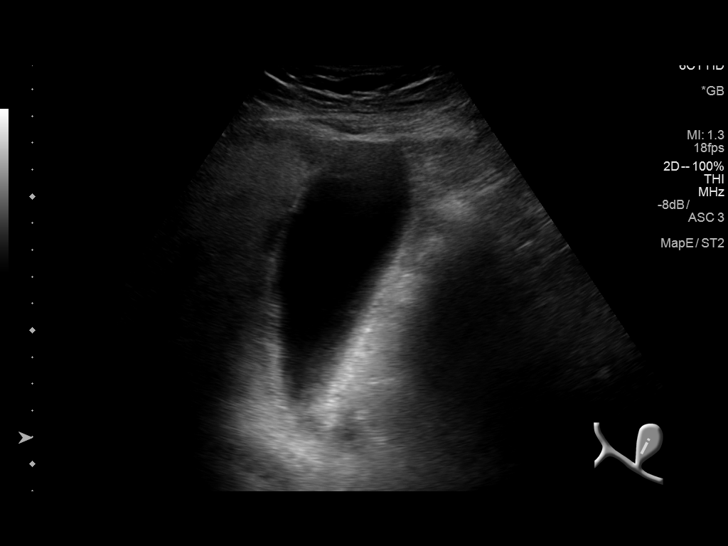
[im 7/79]
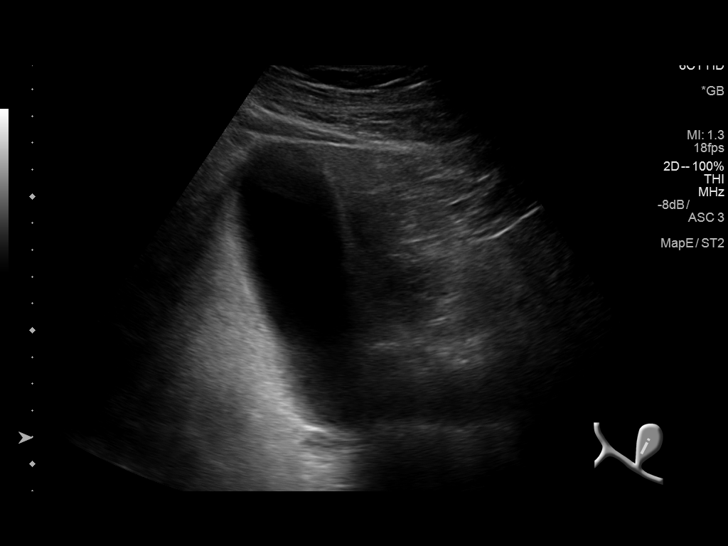
[im 14/79]
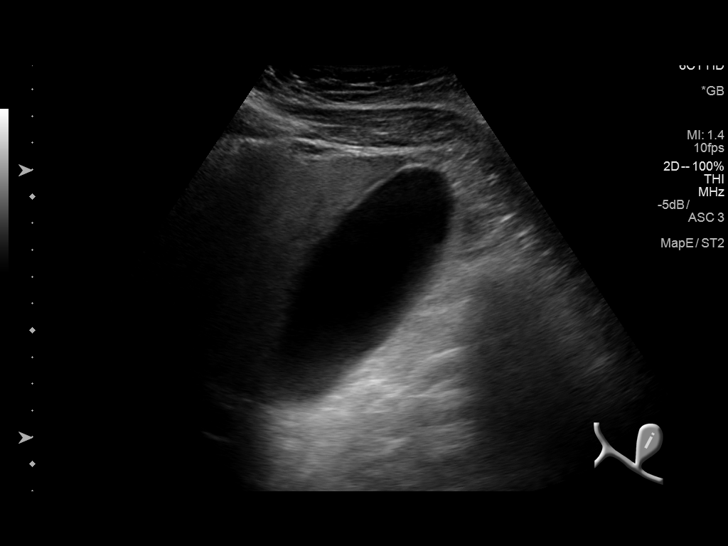
[im 20/79]
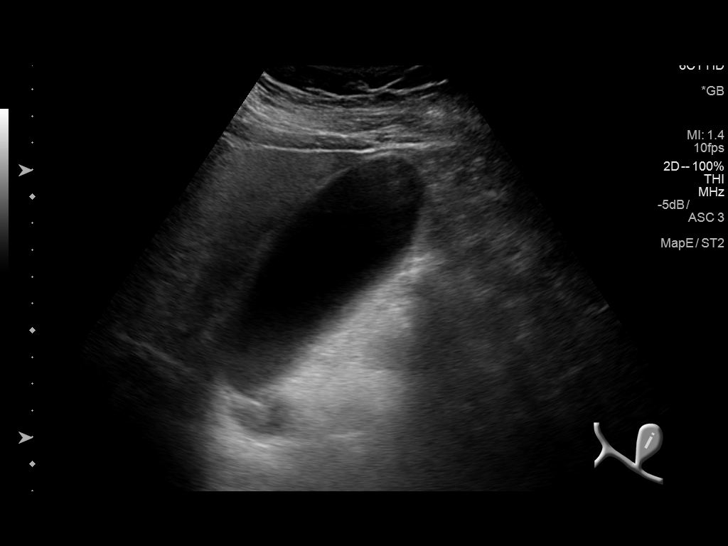
[im 27/79]
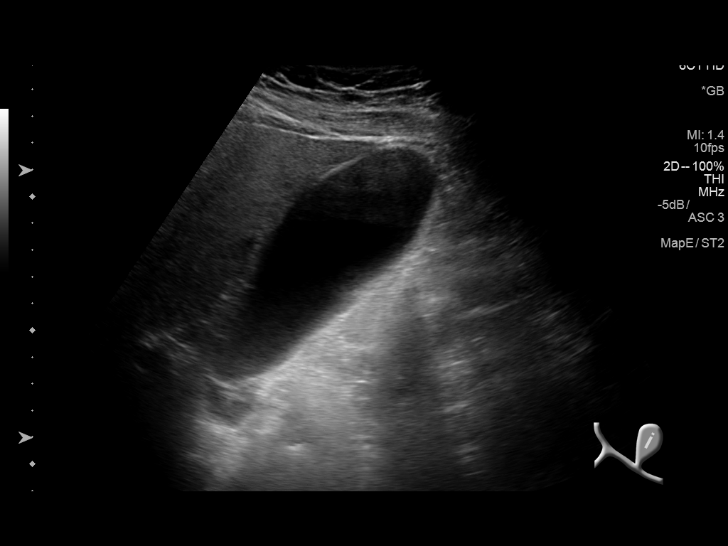
[im 30/79]
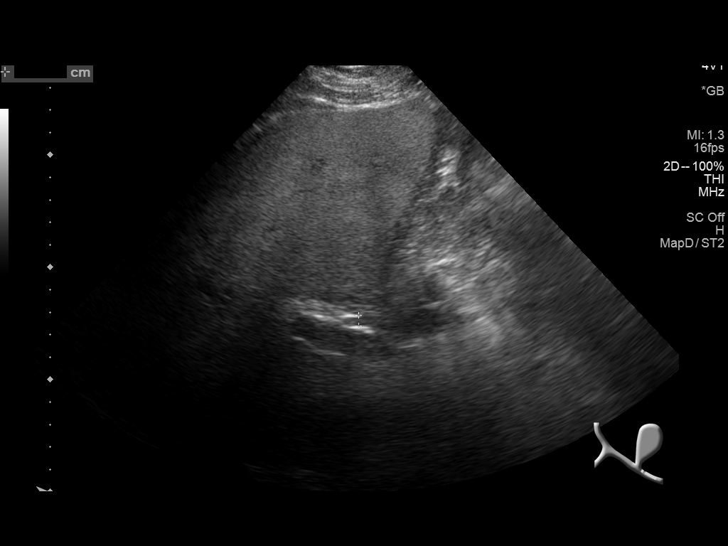
[im 36/79]
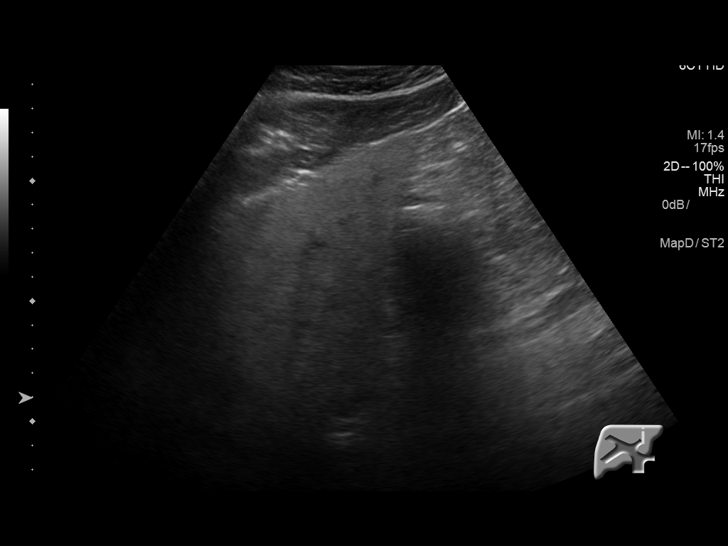
[im 43/79]
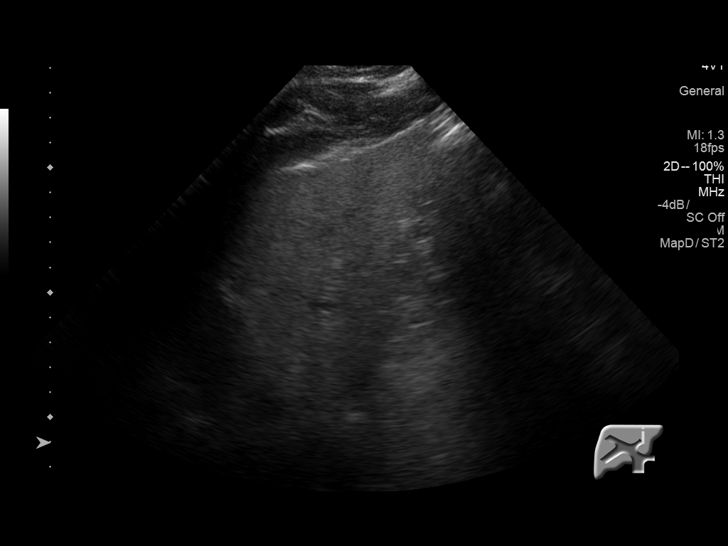
[im 49/79]
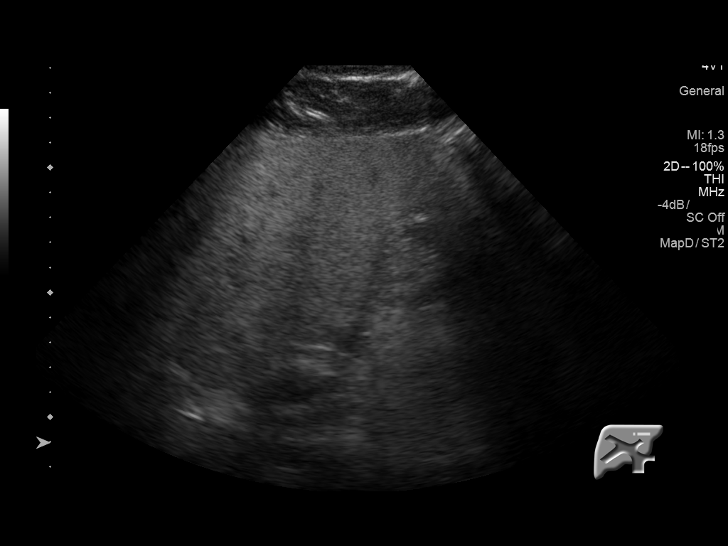
[im 53/79]
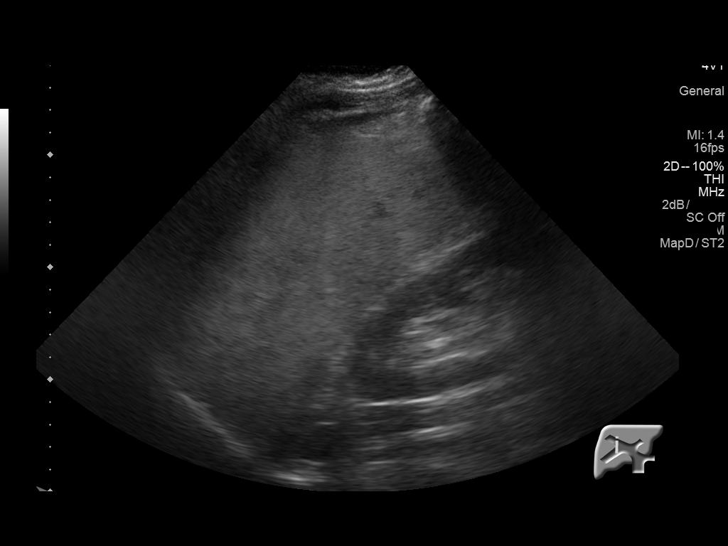
[im 59/79]
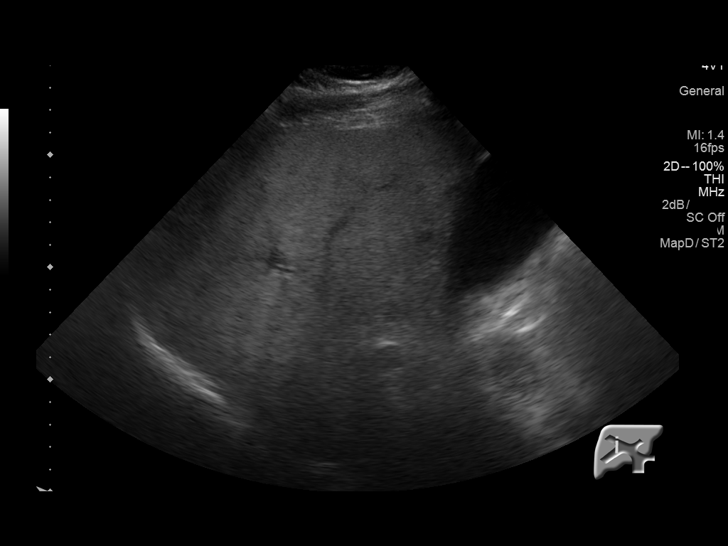
[im 66/79]
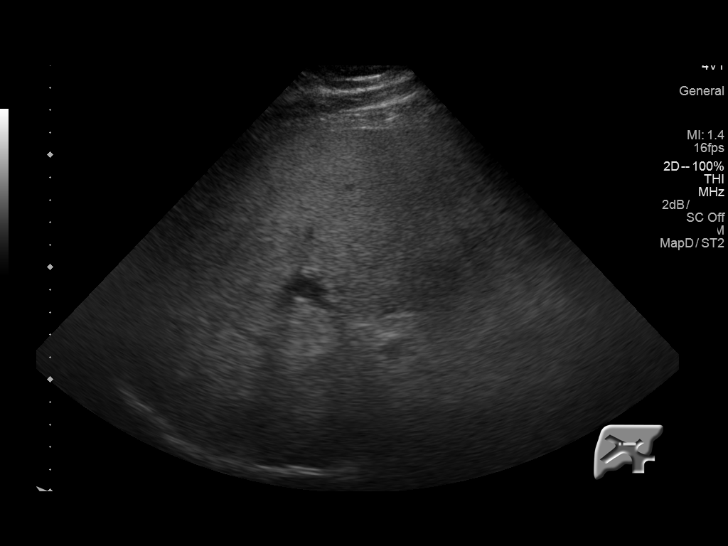
[im 72/79]
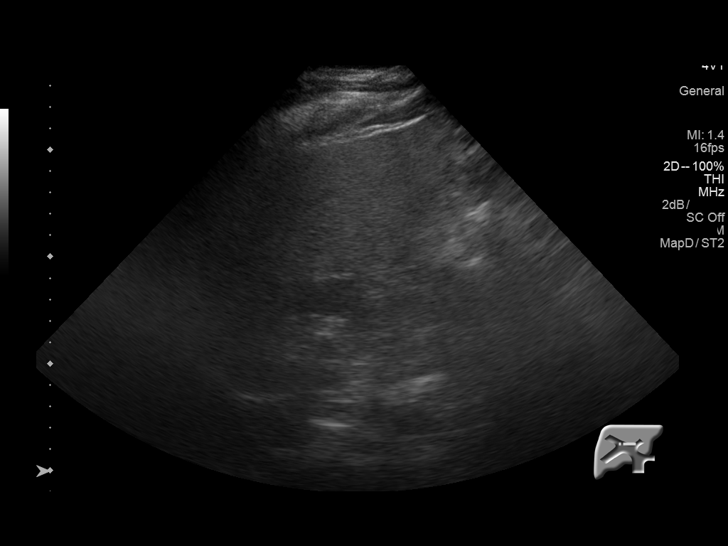
[im 79/79]
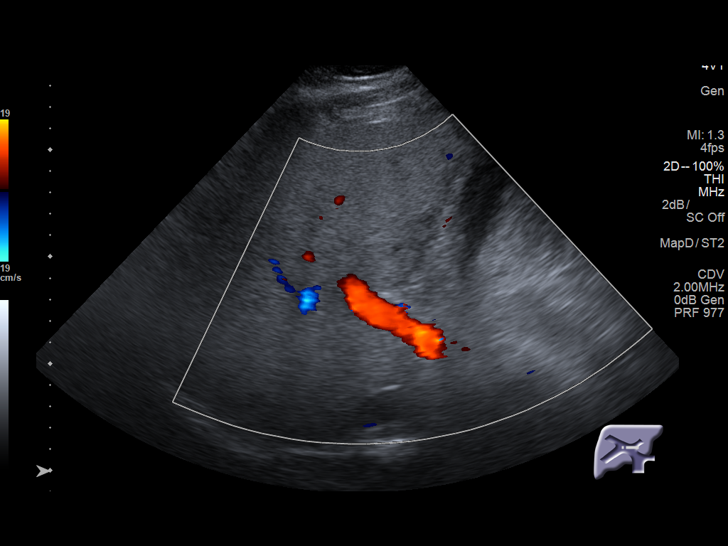

[14 of 25 positions shown; findings below may reference images not displayed]

FINDINGS: Gallbladder:

Distended gallbladder with sludge. No gallstones or gallbladder wall
thickening. Patient was not tender over this region during scanning
per ultrasound technologist.

Common bile duct:

Diameter: 4.7 mm

Liver:

Increased echogenicity consistent with diffuse fatty infiltration
without focal worrisome mass. Portal vein is patent on color Doppler
imaging with normal direction of blood flow towards the liver.
IMPRESSION: Distended gallbladder with sludge. No gallstones or ultrasound
evidence of acute cholecystitis.

Fatty liver.
